# Patient Record
Sex: Female | Born: 1995 | Race: Black or African American | Hispanic: No | Marital: Single | Smoking: Never smoker
Health system: Southern US, Community
[De-identification: ages and names within clinical notes are randomized; demographics above are authoritative.]

## PROBLEM LIST (undated history)

## (undated) DIAGNOSIS — D649 Anemia, unspecified: Secondary | ICD-10-CM

## (undated) DIAGNOSIS — E669 Obesity, unspecified: Secondary | ICD-10-CM

## (undated) DIAGNOSIS — J45909 Unspecified asthma, uncomplicated: Secondary | ICD-10-CM

## (undated) HISTORY — PX: OOPHORECTOMY: SHX86

---

## 2019-12-15 ENCOUNTER — Emergency Department (HOSPITAL_COMMUNITY)
Admission: EM | Admit: 2019-12-15 | Discharge: 2019-12-15 | Disposition: A | Discharge status: Home / Self Care | Payer: Managed Care, Other (non HMO) | Attending: Emergency Medicine | Admitting: Emergency Medicine

## 2019-12-15 ENCOUNTER — Emergency Department (HOSPITAL_COMMUNITY): Payer: Managed Care, Other (non HMO)

## 2019-12-15 ENCOUNTER — Encounter (HOSPITAL_COMMUNITY): Payer: Self-pay

## 2019-12-15 ENCOUNTER — Other Ambulatory Visit: Payer: Self-pay

## 2019-12-15 DIAGNOSIS — J4521 Mild intermittent asthma with (acute) exacerbation: Secondary | ICD-10-CM | POA: Diagnosis not present

## 2019-12-15 DIAGNOSIS — R062 Wheezing: Secondary | ICD-10-CM | POA: Diagnosis present

## 2019-12-15 HISTORY — DX: Unspecified asthma, uncomplicated: J45.909

## 2019-12-15 MED ORDER — ALBUTEROL SULFATE HFA 108 (90 BASE) MCG/ACT IN AERS: 1.0000 | INHALATION_SPRAY | Freq: Four times a day (QID) | RESPIRATORY_TRACT | 0 refills | Status: DC | PRN

## 2019-12-15 MED ORDER — PREDNISONE 20 MG PO TABS
60.0000 mg | ORAL_TABLET | Freq: Once | ORAL | Status: AC
  Administered 2019-12-15: 60 mg via ORAL
  Filled 2019-12-15: qty 3

## 2019-12-15 MED ORDER — HYDROXYZINE HCL 25 MG PO TABS: 25.0000 mg | ORAL_TABLET | Freq: Four times a day (QID) | ORAL | 0 refills | Status: DC

## 2019-12-15 MED ORDER — ALBUTEROL SULFATE HFA 108 (90 BASE) MCG/ACT IN AERS
2.0000 | INHALATION_SPRAY | Freq: Once | RESPIRATORY_TRACT | Status: AC
  Administered 2019-12-15: 2 via RESPIRATORY_TRACT
  Filled 2019-12-15: qty 6.7

## 2019-12-15 MED ORDER — IPRATROPIUM-ALBUTEROL 0.5-2.5 (3) MG/3ML IN SOLN: 3.0000 mL | RESPIRATORY_TRACT | 0 refills | Status: DC | PRN

## 2019-12-15 MED ORDER — IPRATROPIUM BROMIDE HFA 17 MCG/ACT IN AERS
2.0000 | INHALATION_SPRAY | Freq: Once | RESPIRATORY_TRACT | Status: AC
  Administered 2019-12-15: 2 via RESPIRATORY_TRACT
  Filled 2019-12-15: qty 12.9

## 2019-12-15 MED ORDER — PREDNISONE 10 MG PO TABS: 40.0000 mg | ORAL_TABLET | Freq: Every day | ORAL | 0 refills | Status: AC

## 2019-12-15 NOTE — Discharge Instructions (Addendum)
You are seen today for cough and wheezing.  I think that your asthma is flaring up due to your cat.  Your chest x-ray was clear.  We are going to give you prescriptions for medication for your nebulizer machine as well as albuterol inhaler, prednisone and hydroxyzine.  Please follow-up with a new primary care doctor and give them a call as soon as possible. Thank you for allowing me to care for you today. Please return to the emergency department if you have new or worsening symptoms. Take your medications as instructed.

## 2019-12-15 NOTE — ED Notes (Signed)
An After Visit Summary was printed and given to the patient. Discharge instructions given and no further questions at this time. Pt breathing even and unlabored.

## 2019-12-15 NOTE — ED Triage Notes (Signed)
Pt states she is experiencing asthma related symptoms. Pt states she has had symptoms x3 days due to moving boxes and being out in the cold. Pt states she has been using albuterol inhaler with no relief. Pt states she was discharged from hospital with bronchopneumonia and completed prescriptions.

## 2019-12-15 NOTE — ED Notes (Signed)
Pt has inspiratory and expiratory wheezes, [name], PA notified regarding pt lung sounds and order for albuterol inhaler.

## 2019-12-15 NOTE — ED Provider Notes (Signed)
[facility] DEPT Provider Note   CSN: [id number] Arrival date & time: 12/15/19  1801     History Chief Complaint  Patient presents with  . Asthma    Leah Lamb is a 23 y.o. female.  23 year old female with past medical history of asthma presenting to the emergency department for cough and wheeze.  Patient reports that this has been ongoing off and on for several months.  Reports that this all started since she got her cat.  Had history of asthma in the past but it went away and then returned when she got a cat.  Reports dry cough with wheeze.  She reports that she has a nebulizer machine but has run out of the albuterol solution for it.  Also reports that she has tried "every over-the-counter allergy medicine" and that none of these helped.  She just moved here and does not have a primary care doctor.  Denies any fever, chills, chest pain, shortness of breath, nausea, vomiting, sick contacts.        Past Medical History:  Diagnosis Date  . Asthma     There are no problems to display for this patient.   Past Surgical History:  Procedure Laterality Date  . OOPHORECTOMY Left      OB History   No obstetric history on file.     No family history on file.  Social History   Tobacco Use  . Smoking status: Never Smoker  . Smokeless tobacco: Never Used  Substance Use Topics  . Alcohol use: Never  . Drug use: Never    Home Medications Prior to Admission medications   Medication Sig Start Date End Date Taking? Authorizing Provider  albuterol (VENTOLIN HFA) 108 (90 Base) MCG/ACT inhaler Inhale 1-2 puffs into the lungs every 6 (six) hours as needed for wheezing or shortness of breath. 12/15/19   Madilyn Hook A, PA-C  hydrOXYzine (ATARAX/VISTARIL) 25 MG tablet Take 1 tablet (25 mg total) by mouth every 6 (six) hours. 12/15/19   Alveria Apley, PA-C  ipratropium-albuterol (DUONEB) 0.5-2.5 (3) MG/3ML SOLN Take 3 mLs by nebulization  every 4 (four) hours as needed. 12/15/19 01/14/20  Alveria Apley, PA-C  predniSONE (DELTASONE) 10 MG tablet Take 4 tablets (40 mg total) by mouth daily for 5 days. 12/15/19 12/20/19  Alveria Apley, PA-C    Allergies    Amoxicillin and Motrin [ibuprofen]  Review of Systems   Review of Systems  Constitutional: Negative for chills, fatigue and fever.  HENT: Negative for congestion, rhinorrhea, sinus pain and sore throat.   Respiratory: Positive for cough, chest tightness and wheezing. Negative for shortness of breath and stridor.   Cardiovascular: Negative for chest pain, palpitations and leg swelling.  Gastrointestinal: Negative for abdominal pain, nausea and vomiting.  Genitourinary: Negative for dysuria.  Musculoskeletal: Negative for back pain.  Neurological: Negative for dizziness, light-headedness and headaches.    Physical Exam Updated Vital Signs BP 137/79   Pulse 79   Temp 98.7 F (37.1 C) (Oral)   Resp 20   LMP 11/26/2019   SpO2 100%   Physical Exam Vitals and nursing note reviewed.  Constitutional:      General: She is not in acute distress.    Appearance: Normal appearance. She is obese. She is not ill-appearing, toxic-appearing or diaphoretic.  HENT:     Head: Normocephalic.     Nose: Nose normal.     Mouth/Throat:     Mouth: Mucous membranes are moist.  Eyes:     Conjunctiva/sclera: Conjunctivae normal.  Cardiovascular:     Rate and Rhythm: Normal rate and regular rhythm.     Pulses: Normal pulses.  Pulmonary:     Effort: Pulmonary effort is normal. No respiratory distress.     Breath sounds: No stridor. Wheezing and rhonchi present. No rales.  Chest:     Chest wall: No tenderness.  Lymphadenopathy:     Cervical: No cervical adenopathy.  Skin:    General: Skin is dry.  Neurological:     Mental Status: She is alert.  Psychiatric:        Mood and Affect: Mood normal.     ED Results / Procedures / Treatments   Labs (all labs ordered are listed,  but only abnormal results are displayed) Labs Reviewed - No data to display  EKG EKG Interpretation  Date/Time:  Saturday December 15 2019 20:04:14 EST Ventricular Rate:  79 PR Interval:    QRS Duration: 82 QT Interval:  375 QTC Calculation: 430 R Axis:   80 Text Interpretation: Sinus rhythm Probable left atrial enlargement RSR' in V1 or V2, right VCD or RVH No previous tracing Confirmed by Gwyneth Sprout ([id number]) on 12/15/2019 9:40:41 PM   Radiology DG Chest 2 View  Result Date: 12/15/2019 CLINICAL DATA:  Asthma. EXAM: CHEST - 2 VIEW COMPARISON:  None. FINDINGS: The heart size and mediastinal contours are within normal limits. Both lungs are clear. The visualized skeletal structures are unremarkable. IMPRESSION: No active cardiopulmonary disease. Electronically Signed   By: Katherine Mantle M.D.   On: 12/15/2019 20:25    Procedures Procedures (including critical care time)  Medications Ordered in ED Medications  albuterol (VENTOLIN HFA) 108 (90 Base) MCG/ACT inhaler 2 puff (2 puffs Inhalation Given 12/15/19 1837)  predniSONE (DELTASONE) tablet 60 mg (60 mg Oral Given 12/15/19 2143)  ipratropium (ATROVENT HFA) inhaler 2 puff (2 puffs Inhalation Given 12/15/19 2143)    ED Course  I have reviewed the triage vital signs and the nursing notes.  Pertinent labs & imaging results that were available during my care of the patient were reviewed by me and considered in my medical decision making (see chart for details).  Clinical Course as of Dec 15 2155  Sat Dec 15, 2019  2152 Patient with history of asthma presenting with the same.  Reports symptoms got worse when she got a new cat a couple months ago.  Chest x-ray is clear.  No fever or signs of acute illness.  We will treat her with ipratropium, albuterol, prednisone.  Advised that she should be on a daily allergy medication or may need to get rid of her cat.  We will give her prescriptions for albuterol inhaler as well as  DuoNeb vials and prednisone and hydroxyzine.  She was given referral to new primary care doctor as she is new to this area.   [KM]    Clinical Course User Index [KM] Jeral Pinch   MDM Rules/Calculators/A&P                     Based on review of vitals, medical screening exam, lab work and/or imaging, there does not appear to be an acute, emergent etiology for the patient's symptoms. Counseled pt on good return precautions and encouraged both PCP and ED follow-up as needed.  Prior to discharge, I also discussed incidental imaging findings with patient in detail and advised appropriate, recommended follow-up in detail.  Clinical Impression: 1. Mild intermittent  asthma with exacerbation     Disposition: Discharge  Prior to providing a prescription for a controlled substance, I independently reviewed the patient's recent prescription history on the [place] Controlled Substance Reporting System. The patient had no recent or regular prescriptions and was deemed appropriate for a brief, less than 3 day prescription of narcotic for acute analgesia.  This note was prepared with assistance of Conservation officer, historic buildingsDragon voice recognition software. Occasional wrong-word or sound-a-like substitutions may have occurred due to the inherent limitations of voice recognition software.  Final Clinical Impression(s) / ED Diagnoses Final diagnoses:  Mild intermittent asthma with exacerbation    Rx / DC Orders ED Discharge Orders         Ordered    predniSONE (DELTASONE) 10 MG tablet  Daily     12/15/19 2156    albuterol (VENTOLIN HFA) 108 (90 Base) MCG/ACT inhaler  Every 6 hours PRN     12/15/19 2156    hydrOXYzine (ATARAX/VISTARIL) 25 MG tablet  Every 6 hours     12/15/19 2156    ipratropium-albuterol (DUONEB) 0.5-2.5 (3) MG/3ML SOLN  Every 4 hours PRN     12/15/19 2156           Jeral PinchMcLean, Caidence Kaseman A, PA-C 12/15/19 2156    Gwyneth SproutPlunkett, Whitney, MD 12/15/19 2345

## 2019-12-15 NOTE — ED Notes (Signed)
Pt transported to Xray. 

## 2019-12-27 ENCOUNTER — Ambulatory Visit: Payer: Managed Care, Other (non HMO) | Admitting: Adult Health Nurse Practitioner

## 2020-01-04 ENCOUNTER — Other Ambulatory Visit: Payer: Self-pay

## 2020-01-04 ENCOUNTER — Emergency Department (HOSPITAL_COMMUNITY): Payer: Managed Care, Other (non HMO)

## 2020-01-04 ENCOUNTER — Emergency Department (HOSPITAL_COMMUNITY)
Admission: EM | Admit: 2020-01-04 | Discharge: 2020-01-04 | Disposition: A | Discharge status: Home / Self Care | Payer: Managed Care, Other (non HMO) | Attending: Emergency Medicine | Admitting: Emergency Medicine

## 2020-01-04 ENCOUNTER — Encounter (HOSPITAL_COMMUNITY): Payer: Self-pay | Admitting: Emergency Medicine

## 2020-01-04 DIAGNOSIS — J4521 Mild intermittent asthma with (acute) exacerbation: Secondary | ICD-10-CM

## 2020-01-04 DIAGNOSIS — R062 Wheezing: Secondary | ICD-10-CM | POA: Diagnosis present

## 2020-01-04 MED ORDER — ALBUTEROL SULFATE HFA 108 (90 BASE) MCG/ACT IN AERS: 1.0000 | INHALATION_SPRAY | Freq: Four times a day (QID) | RESPIRATORY_TRACT | 0 refills | Status: DC | PRN

## 2020-01-04 MED ORDER — PREDNISONE 20 MG PO TABS
40.0000 mg | ORAL_TABLET | Freq: Once | ORAL | Status: AC
  Administered 2020-01-04: 40 mg via ORAL
  Filled 2020-01-04: qty 2

## 2020-01-04 MED ORDER — HYDROXYZINE HCL 25 MG PO TABS: 25.0000 mg | ORAL_TABLET | Freq: Four times a day (QID) | ORAL | 0 refills | Status: DC | PRN

## 2020-01-04 MED ORDER — PREDNISONE 20 MG PO TABS: 40.0000 mg | ORAL_TABLET | Freq: Every day | ORAL | 0 refills | Status: AC

## 2020-01-04 MED ORDER — ALBUTEROL SULFATE HFA 108 (90 BASE) MCG/ACT IN AERS
4.0000 | INHALATION_SPRAY | Freq: Once | RESPIRATORY_TRACT | Status: AC
  Administered 2020-01-04: 4 via RESPIRATORY_TRACT
  Filled 2020-01-04: qty 6.7

## 2020-01-04 NOTE — ED Triage Notes (Signed)
Pt reports has a cat that is flaring up her asthma. Reports coughing up green phlegm 3 days ago.

## 2020-01-04 NOTE — ED Provider Notes (Signed)
Emergency Department Provider Note   I have reviewed the triage vital signs and the nursing notes.   HISTORY  Chief Complaint Asthma   HPI Leah Lamb is a 24 y.o. female with PMH of asthma and seasonal allergies presents to the emergency department for evaluation of wheezing, shortness of breath, productive cough over the past several days.  Patient states that she is been out of her allergy medication, hydroxyzine, and thinks that her cat may be flaring up her allergies and asthma symptoms.  She describes some central chest tightness which is typical of her asthma flare.  She has had wheezing but does not seem to be responding to her nebulizer treatments at home.  She denies any fevers, chills, COVID-19 contacts.  She has had a slightly productive cough over the past several days which is unusual for her asthma symptoms.  Denies any other radiation of symptoms or modifying factors.  Past Medical History:  Diagnosis Date  . Asthma     There are no problems to display for this patient.   Past Surgical History:  Procedure Laterality Date  . OOPHORECTOMY Left     Allergies Amoxicillin and Motrin [ibuprofen]  No family history on file.  Social History Social History   Tobacco Use  . Smoking status: Never Smoker  . Smokeless tobacco: Never Used  Substance Use Topics  . Alcohol use: Never  . Drug use: Never    Review of Systems  Constitutional: No fever/chills Eyes: No visual changes. ENT: No sore throat. Cardiovascular: Positive central chest tightness typical of asthma flare.  Respiratory: Positive shortness of breath. Positive productive cough.  Gastrointestinal: No abdominal pain.  No nausea, no vomiting.  No diarrhea.  No constipation. Genitourinary: Negative for dysuria. Musculoskeletal: Negative for back pain. Skin: Negative for rash. Neurological: Negative for headaches, focal weakness or numbness.  10-point ROS otherwise  negative.  ____________________________________________   PHYSICAL EXAM:  VITAL SIGNS: ED Triage Vitals  Enc Vitals Group     BP 01/04/20 1458 129/80     Pulse Rate 01/04/20 1458 (!) 103     Resp 01/04/20 1458 19     Temp 01/04/20 1458 98.7 F (37.1 C)     Temp Source 01/04/20 1458 Oral     SpO2 01/04/20 1458 100 %   Constitutional: Alert and oriented. Well appearing and in no acute distress. Eyes: Conjunctivae are normal.  Head: Atraumatic. Nose: No congestion/rhinnorhea. Mouth/Throat: Mucous membranes are moist.   Neck: No stridor.   Cardiovascular: Tachycardia. Good peripheral circulation. Grossly normal heart sounds.   Respiratory: Slight increased respiratory effort.  No retractions. Lungs with bilateral end-expiratory wheezing. No rales or rhonchi.  Gastrointestinal: No distention.  Musculoskeletal: No gross deformities of extremities. Neurologic:  Normal speech and language.  Skin:  Skin is warm, dry and intact. No rash noted ____________________________________________  RADIOLOGY  DG Chest Portable 1 View  Result Date: 01/04/2020 CLINICAL DATA:  Dyspnea, productive cough, asthma EXAM: PORTABLE CHEST 1 VIEW COMPARISON:  12/15/2019 chest radiograph. FINDINGS: Stable cardiomediastinal silhouette with normal heart size. No pneumothorax. No pleural effusion. Hyperinflated lungs. No pulmonary edema. No acute consolidative airspace disease. IMPRESSION: Hyperinflated lungs, suggesting obstructive lung disease. Otherwise no active cardiopulmonary disease. Electronically Signed   By: Delbert Phenix M.D.   On: 01/04/2020 18:21    ____________________________________________   PROCEDURES  Procedure(s) performed:   Procedures  None  ____________________________________________   INITIAL IMPRESSION / ASSESSMENT AND PLAN / ED COURSE  Pertinent labs & imaging results  that were available during my care of the patient were reviewed by me and considered in my medical decision  making (see chart for details).   Patient presents to the emergency department with likely asthma flare symptoms.  She does have some central chest tightness but states this is typical of her asthma.  She does have wheezing in bilateral lung fields.  My suspicion for PE is very low.  She does have mild tachycardia on arrival but was using albuterol treatments prior to ED presentation.  She is not having pleuritic chest discomfort or other concerning historical features for PE.  I do plan for chest x-ray with her productive cough and will give albuterol, steroid here in the ED. I have sent refills for her medications including albuterol, hydroxyzine, and will send home on steroid burst. Patient in no acute distress.   CXR without infiltrate. Plan for asthma mgmt as an outpatient. Contact given for PCP locally. Discussed ED return precautions.  ____________________________________________  FINAL CLINICAL IMPRESSION(S) / ED DIAGNOSES  Final diagnoses:  Mild intermittent asthma with exacerbation     MEDICATIONS GIVEN DURING THIS VISIT:  Medications  albuterol (VENTOLIN HFA) 108 (90 Base) MCG/ACT inhaler 4 puff (4 puffs Inhalation Given 01/04/20 1900)  predniSONE (DELTASONE) tablet 40 mg (40 mg Oral Given 01/04/20 1858)     NEW OUTPATIENT MEDICATIONS STARTED DURING THIS VISIT:  Discharge Medication List as of 01/04/2020  6:43 PM    START taking these medications   Details  !! albuterol (VENTOLIN HFA) 108 (90 Base) MCG/ACT inhaler Inhale 1-2 puffs into the lungs every 6 (six) hours as needed for wheezing or shortness of breath., Starting Fri 01/04/2020, Normal    predniSONE (DELTASONE) 20 MG tablet Take 2 tablets (40 mg total) by mouth daily for 4 days., Starting Sat 01/05/2020, Until Wed 01/09/2020, Normal     !! - Potential duplicate medications found. Please discuss with provider.      Note:  This document was prepared using Dragon voice recognition software and may include unintentional  dictation errors.  Nanda Quinton, MD, [facility] Emergency Medicine    [place] Camps, [place], MD 01/05/20 [phone]

## 2020-01-04 NOTE — Discharge Instructions (Signed)
We believe that your symptoms are caused today by an exacerbation of your asthma.  Please take the prescribed medications and any medications that you have at home.  Follow up with your doctor as recommended.  If you develop any new or worsening symptoms, including but not limited to fever, persistent vomiting, worsening shortness of breath, or other symptoms that concern you, please return to the Emergency Department immediately. ° ° °Asthma °Asthma is a recurring condition in which the airways tighten and narrow. Asthma can make it difficult to breathe. It can cause coughing, wheezing, and shortness of breath. Asthma episodes, also called asthma attacks, range from minor to life-threatening. Asthma cannot be cured, but medicines and lifestyle changes can help control it. °CAUSES °Asthma is believed to be caused by inherited (genetic) and environmental factors, but its exact cause is unknown. Asthma may be triggered by allergens, lung infections, or irritants in the air. Asthma triggers are different for each person. Common triggers include:  °Animal dander. °Dust mites. °Cockroaches. °Pollen from trees or grass. °Mold. °Smoke. °Air pollutants such as dust, household cleaners, hair sprays, aerosol sprays, paint fumes, strong chemicals, or strong odors. °Cold air, weather changes, and winds (which increase molds and pollens in the air). °Strong emotional expressions such as crying or laughing hard. °Stress. °Certain medicines (such as aspirin) or types of drugs (such as beta-blockers). °Sulfites in foods and drinks. Foods and drinks that may contain sulfites include dried fruit, potato chips, and sparkling grape juice. °Infections or inflammatory conditions such as the flu, a cold, or an inflammation of the nasal membranes (rhinitis). °Gastroesophageal reflux disease (GERD). °Exercise or strenuous activity. °SYMPTOMS °Symptoms may occur immediately after asthma is triggered or many hours later. Symptoms  include: °Wheezing. °Excessive nighttime or early morning coughing. °Frequent or severe coughing with a common cold. °Chest tightness. °Shortness of breath. °DIAGNOSIS  °The diagnosis of asthma is made by a review of your medical history and a physical exam. Tests may also be performed. These may include: °Lung function studies. These tests show how much air you breathe in and out. °Allergy tests. °Imaging tests such as X-rays. °TREATMENT  °Asthma cannot be cured, but it can usually be controlled. Treatment involves identifying and avoiding your asthma triggers. It also involves medicines. There are 2 classes of medicine used for asthma treatment:  °Controller medicines. These prevent asthma symptoms from occurring. They are usually taken every day. °Reliever or rescue medicines. These quickly relieve asthma symptoms. They are used as needed and provide short-term relief. °Your health care provider will help you create an asthma action plan. An asthma action plan is a written plan for managing and treating your asthma attacks. It includes a list of your asthma triggers and how they may be avoided. It also includes information on when medicines should be taken and when their dosage should be changed. An action plan may also involve the use of a device called a peak flow meter. A peak flow meter measures how well the lungs are working. It helps you monitor your condition. °HOME CARE INSTRUCTIONS  °Take medicines only as directed by your health care provider. Speak with your health care provider if you have questions about how or when to take the medicines. °Use a peak flow meter as directed by your health care provider. Record and keep track of readings. °Understand and use the action plan to help minimize or stop an asthma attack without needing to seek medical care. °Control your home environment in the following   ways to help prevent asthma attacks: °Do not smoke. Avoid being exposed to secondhand smoke. °Change  your heating and air conditioning filter regularly. °Limit your use of fireplaces and wood stoves. °Get rid of pests (such as roaches and mice) and their droppings. °Throw away plants if you see mold on them. °Clean your floors and dust regularly. Use unscented cleaning products. °Try to have someone else vacuum for you regularly. Stay out of rooms while they are being vacuumed and for a short while afterward. If you vacuum, use a dust mask from a hardware store, a double-layered or microfilter vacuum cleaner bag, or a vacuum cleaner with a HEPA filter. °Replace carpet with wood, tile, or vinyl flooring. Carpet can trap dander and dust. °Use allergy-proof pillows, mattress covers, and box spring covers. °Wash bed sheets and blankets every week in hot water and dry them in a dryer. °Use blankets that are made of polyester or cotton. °Clean bathrooms and kitchens with bleach. If possible, have someone repaint the walls in these rooms with mold-resistant paint. Keep out of the rooms that are being cleaned and painted. °Wash hands frequently. °SEEK MEDICAL CARE IF:  °You have wheezing, shortness of breath, or a cough even if taking medicine to prevent attacks. °The colored mucus you cough up (sputum) is thicker than usual. °Your sputum changes from clear or [place] to yellow, green, gray, or bloody. °You have any problems that may be related to the medicines you are taking (such as a rash, itching, swelling, or trouble breathing). °You are using a reliever medicine more than 2-3 times per week. °Your peak flow is still at 50-79% of your personal best after following your action plan for 1 hour. °You have a fever. °SEEK IMMEDIATE MEDICAL CARE IF:  °You seem to be getting worse and are unresponsive to treatment during an asthma attack. °You are short of breath even at rest. °You get short of breath when doing very little physical activity. °You have difficulty eating, drinking, or talking due to asthma symptoms. °You  develop chest pain. °You develop a fast heartbeat. °You have a bluish color to your lips or fingernails. °You are light-headed, dizzy, or faint. °Your peak flow is less than 50% of your personal best. °MAKE SURE YOU:  °Understand these instructions. °Will watch your condition. °Will get help right away if you are not doing well or get worse. °Document Released: 12/13/2005 Document Revised: 04/29/2014 Document Reviewed: 07/12/2013 °[employer]® Patient Information ©2015 [employer]. This information is not intended to replace advice given to you by your health care provider. Make sure you discuss any questions you have with your health care provider. ° °How to Use a Nebulizer °If you have asthma or other breathing problems, you might need to breathe in (inhale) medicine. This can be done with a nebulizer. A nebulizer is a device that turns liquid medicine into a mist that you can inhale.  °There are different kinds of nebulizers. Most are small. With some, you breathe in through a mouthpiece. With others, a mask fits over your nose and mouth. Most nebulizers must be connected to a small air compressor. Air is forced through tubing from the compressor to the nebulizer. The forced air changes the liquid into a fine spray. °RISKS AND COMPLICATIONS °The nebulizer must work properly for it to help your breathing. If the nebulizer does not produce mist, or if foam comes out, this indicates that the nebulizer is not working properly. Sometimes a filter can get clogged, or   there might be a problem with the air compressor. Check the instruction booklet that came with your nebulizer. It should tell you how to fix problems or where to call for help. You should have at least one extra nebulizer at home. That way, you will always have one when you need it.  °HOW TO PREPARE BEFORE USING THE NEBULIZER °Take these steps before using the nebulizer: °Check your medicine. Make sure it has not expired and is not damaged in any way.    °Wash your hands with soap and water.   °Put all the parts of your nebulizer on a sturdy, flat surface. Make sure the tubing connects the compressor and the nebulizer. °Measure the liquid medicine according to your health care provider's instructions. Pour it into the nebulizer. °Attach the mouthpiece or mask.   °Test the nebulizer by turning it on to make sure a spray is coming out. Then, turn it off.   °HOW TO USE THE NEBULIZER °Sit down and focus on staying relaxed.   °If your nebulizer has a mask, put it over your nose and mouth. If you use a mouthpiece, put it in your mouth. Press your lips firmly around the mouthpiece. °Turn on the nebulizer.   °Breathe out.   °Some nebulizers have a finger valve. If yours does, cover up the air hole so the air gets to the nebulizer. °Once the medicine begins to mist out, take slow, deep breaths. If there is a finger valve, release it at the end of your breath. °Continue taking slow, deep breaths until the nebulizer is empty.   °Be sure to stop the machine at any point if you start coughing or if the medicine foams or bubbles. °HOW TO CLEAN THE NEBULIZER  °The nebulizer and all its parts must be kept very clean. Follow the manufacturer's instructions for cleaning. For most nebulizers, you should follow these guidelines: °Wash the nebulizer after each use. Use warm water and soap. Rinse it well. Shake the nebulizer to remove extra water. Put it on a clean towel until it is completely dry. To make sure it is dry, put the nebulizer back together. Turn on the compressor for a few minutes. This will blow air through the nebulizer.   °Do not wash the tubing or the finger valve.   °Store the nebulizer in a dust-free place.   °Inspect the filter every week. Replace it any time it looks dirty.   °Sometimes the nebulizer will need a more complete cleaning. The instruction booklet should say how often you need to do this. °SEEK MEDICAL CARE IF:  °You continue to have difficulty  breathing.   °You have trouble using the nebulizer.   °Document Released: 12/01/2009 Document Revised: 04/29/2014 Document Reviewed: 06/04/2013 °Exit[employer]® Patient Information ©2015 [employer]. This information is not intended to replace advice given to you by your health care provider. Make sure you discuss any questions you have with your health care provider. ° °How to Use an Inhaler °Proper inhaler technique is very important. Good technique ensures that the medicine reaches the lungs. Poor technique results in depositing the medicine on the tongue and back of the throat rather than in the airways. If you do not use the inhaler with good technique, the medicine will not help you. °STEPS TO FOLLOW IF USING AN INHALER WITHOUT AN EXTENSION TUBE °Remove the cap from the inhaler. °If you are using the inhaler for the first time, you will need to prime it. Shake the inhaler for   5 seconds and release four puffs into the air, away from your face. Ask your health care provider or pharmacist if you have questions about priming your inhaler. °Shake the inhaler for 5 seconds before each breath in (inhalation). °Position the inhaler so that the top of the canister faces up. °Put your index finger on the top of the medicine canister. Your thumb supports the bottom of the inhaler. °Open your mouth. °Either place the inhaler between your teeth and place your lips tightly around the mouthpiece, or hold the inhaler 1-2 inches away from your open mouth. If you are unsure of which technique to use, ask your health care provider. °Breathe out (exhale) normally and as completely as possible. °Press the canister down with your index finger to release the medicine. °At the same time as the canister is pressed, inhale deeply and slowly until your lungs are completely filled. This should take 4-6 seconds. Keep your tongue down. °Hold the medicine in your lungs for 5-10 seconds (10 seconds is best). This helps the medicine get into the  small airways of your lungs. °Breathe out slowly, through pursed lips. Whistling is an example of pursed lips. °Wait at least 15-30 seconds between puffs. Continue with the above steps until you have taken the number of puffs your health care provider has ordered. Do not use the inhaler more than your health care provider tells you. °Replace the cap on the inhaler. °Follow the directions from your health care provider or the inhaler insert for cleaning the inhaler. °STEPS TO FOLLOW IF USING AN INHALER WITH AN EXTENSION (SPACER) °Remove the cap from the inhaler. °If you are using the inhaler for the first time, you will need to prime it. Shake the inhaler for 5 seconds and release four puffs into the air, away from your face. Ask your health care provider or pharmacist if you have questions about priming your inhaler. °Shake the inhaler for 5 seconds before each breath in (inhalation). °Place the open end of the spacer onto the mouthpiece of the inhaler. °Position the inhaler so that the top of the canister faces up and the spacer mouthpiece faces you. °Put your index finger on the top of the medicine canister. Your thumb supports the bottom of the inhaler and the spacer. °Breathe out (exhale) normally and as completely as possible. °Immediately after exhaling, place the spacer between your teeth and into your mouth. Close your lips tightly around the spacer. °Press the canister down with your index finger to release the medicine. °At the same time as the canister is pressed, inhale deeply and slowly until your lungs are completely filled. This should take 4-6 seconds. Keep your tongue down and out of the way. °Hold the medicine in your lungs for 5-10 seconds (10 seconds is best). This helps the medicine get into the small airways of your lungs. Exhale. °Repeat inhaling deeply through the spacer mouthpiece. Again hold that breath for up to 10 seconds (10 seconds is best). Exhale slowly. If it is difficult to take  this second deep breath through the spacer, breathe normally several times through the spacer. Remove the spacer from your mouth. °Wait at least 15-30 seconds between puffs. Continue with the above steps until you have taken the number of puffs your health care provider has ordered. Do not use the inhaler more than your health care provider tells you. °Remove the spacer from the inhaler, and place the cap on the inhaler. °Follow the directions from your health care provider   or the inhaler insert for cleaning the inhaler and spacer. °If you are using different kinds of inhalers, use your quick relief medicine to open the airways 10-15 minutes before using a steroid if instructed to do so by your health care provider. If you are unsure which inhalers to use and the order of using them, ask your health care provider, nurse, or respiratory therapist. °If you are using a steroid inhaler, always rinse your mouth with water after your last puff, then gargle and spit out the water. Do not swallow the water. °AVOID: °Inhaling before or after starting the spray of medicine. It takes practice to coordinate your breathing with triggering the spray. °Inhaling through the nose (rather than the mouth) when triggering the spray. °HOW TO DETERMINE IF YOUR INHALER IS FULL OR NEARLY EMPTY °You cannot know when an inhaler is empty by shaking it. A few inhalers are now being made with dose counters. Ask your health care provider for a prescription that has a dose counter if you feel you need that extra help. If your inhaler does not have a counter, ask your health care provider to help you determine the date you need to refill your inhaler. Write the refill date on a calendar or your inhaler canister. Refill your inhaler 7-10 days before it runs out. Be sure to keep an adequate supply of medicine. This includes making sure it is not expired, and that you have a spare inhaler.  °SEEK MEDICAL CARE IF:  °Your symptoms are only partially  relieved with your inhaler. °You are having trouble using your inhaler. °You have some increase in phlegm. °SEEK IMMEDIATE MEDICAL CARE IF:  °You feel little or no relief with your inhalers. You are still wheezing and are feeling shortness of breath or tightness in your chest or both. °You have dizziness, headaches, or a fast heart rate. °You have chills, fever, or night sweats. °You have a noticeable increase in phlegm production, or there is blood in the phlegm. °MAKE SURE YOU:  °Understand these instructions. °Will watch your condition. °Will get help right away if you are not doing well or get worse. °Document Released: 12/10/2000 Document Revised: 10/03/2013 Document Reviewed: 07/12/2013 °Exit[employer]® Patient Information ©2015 [employer]. This information is not intended to replace advice given to you by your health care provider. Make sure you discuss any questions you have with your health care provider. ° ° ° °

## 2020-01-11 ENCOUNTER — Ambulatory Visit (INDEPENDENT_AMBULATORY_CARE_PROVIDER_SITE_OTHER): Payer: Managed Care, Other (non HMO) | Admitting: Allergy

## 2020-01-11 ENCOUNTER — Encounter: Payer: Self-pay | Admitting: Allergy

## 2020-01-11 ENCOUNTER — Other Ambulatory Visit: Payer: Self-pay

## 2020-01-11 VITALS — BP 104/62 | HR 93 | Temp 97.8°F | Resp 16 | Ht 64.0 in | Wt 196.6 lb

## 2020-01-11 DIAGNOSIS — H1013 Acute atopic conjunctivitis, bilateral: Secondary | ICD-10-CM

## 2020-01-11 DIAGNOSIS — J4540 Moderate persistent asthma, uncomplicated: Secondary | ICD-10-CM | POA: Diagnosis not present

## 2020-01-11 DIAGNOSIS — T781XXD Other adverse food reactions, not elsewhere classified, subsequent encounter: Secondary | ICD-10-CM | POA: Diagnosis not present

## 2020-01-11 MED ORDER — ALBUTEROL SULFATE (2.5 MG/3ML) 0.083% IN NEBU: 2.5000 mg | INHALATION_SOLUTION | Freq: Four times a day (QID) | RESPIRATORY_TRACT | 1 refills | Status: DC | PRN

## 2020-01-11 MED ORDER — BUDESONIDE-FORMOTEROL FUMARATE 160-4.5 MCG/ACT IN AERO: 2.0000 | INHALATION_SPRAY | Freq: Two times a day (BID) | RESPIRATORY_TRACT | 5 refills | Status: DC

## 2020-01-11 MED ORDER — MONTELUKAST SODIUM 10 MG PO TABS: 10.0000 mg | ORAL_TABLET | Freq: Every day | ORAL | 5 refills | Status: DC

## 2020-01-11 NOTE — Progress Notes (Signed)
New Patient Note  RE: Leah Lamb MRN: [id number] DOB: 12-21-1996 Date of Office Visit: 01/11/2020  Referring provider: No ref. provider found Primary care provider: Patient, No Pcp Per  Chief Complaint:  Cat flaring asthma  History of present illness: Leah Lamb is a 24 y.o. female presenting today for evaluation of asthma and cat allergy.   She is new to the area and has not identified PCP yet.   She has a history of asthma as a child and states she had a 3 year period where she was asymptomatic until recently.  She got a cat about 5 months ago and she feels it is triggering asthma attacks.  She finds herself coughing more, wheezing and having chest tightness.  She is using her albuterol nebulizer daily mostly at night.  She also reports using albuterol inhaler before exercising more now as well.  She did have 2 ED visits for asthma symptoms.  On 12/15/2019 in ED she was treated with albuterol, atrovent and prednisone.  CXR was normal.  She was given prescriptions for albuterol, duoneb, prendisone and hydroxyzine.  ED visit from 01/04/2020 she has a CXR that again was normal.  She was treated with albuterol and prednisone.    She does report several hospitalizations as a child but none in adulthood.  She states she used to singulair and claritin when she was younger.   She does report itchy eyes year-round.  Denies any nasal congestion/drainage or sneezing symptoms.   No history of eczema.    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  She has noted this over the past 3 years.  She states she has not had any latex exposure that she is aware of.    Review of systems: Review of Systems  Constitutional: Negative.   HENT: Negative.   Eyes: Negative.   Respiratory: Positive for cough and wheezing.   Cardiovascular: Negative.   Gastrointestinal: Negative.   Musculoskeletal: Negative.   Skin: Negative.     Neurological: Negative.     All other systems negative unless noted above in HPI  Past medical history: Past Medical History:  Diagnosis Date  . Asthma     Past surgical history: Past Surgical History:  Procedure Laterality Date  . OOPHORECTOMY Left     Family history:  Family History  Problem Relation Age of Onset  . Asthma Neg Hx   . Allergic rhinitis Neg Hx   . Eczema Neg Hx     Social history: She lives in an apartment with carpeting with electric heating, heat pump and central cooling.  No concern for water damage, mildew or roaches in the home.  She is a patient care advocate.  No smoking history or exposure.   Medication List: Current Outpatient Medications  Medication Sig Dispense Refill  . albuterol (PROVENTIL) (2.5 MG/3ML) 0.083% nebulizer solution Take 2.5 mg by nebulization every 6 (six) hours as needed for wheezing or shortness of breath.    Marland Kitchen albuterol (VENTOLIN HFA) 108 (90 Base) MCG/ACT inhaler Inhale 1-2 puffs into the lungs every 6 (six) hours as needed for wheezing or shortness of breath. 8 g 0  . hydrOXYzine (ATARAX/VISTARIL) 25 MG tablet Take 1 tablet (25 mg total) by mouth every 6 (six) hours as needed for itching (allergy symptoms). 12 tablet 0  . budesonide-formoterol (SYMBICORT) 160-4.5 MCG/ACT inhaler Inhale 2 puffs into the lungs 2 (two) times daily.  1 Inhaler 5  . montelukast (SINGULAIR) 10 MG tablet Take 1 tablet (10 mg total) by mouth at bedtime. 30 tablet 5   No current facility-administered medications for this visit.    Known medication allergies: Allergies  Allergen Reactions  . Amoxicillin Hives  . Motrin [Ibuprofen] Itching    Physical examination: Blood pressure 104/62, pulse 93, temperature 97.8 F (36.6 C), temperature source Temporal, resp. rate 16, height 5\' 4"  (1.626 m), weight 196 lb 9.6 oz (89.2 kg), last menstrual period 12/16/2019, SpO2 100 %.  General: Alert, interactive, in no acute distress. HEENT: PERRLA, TMs  pearly gray, turbinates mildly edematous without discharge, post-pharynx non erythematous. Neck: Supple without lymphadenopathy. Lungs: Mildly decreased breath sounds with expiratory wheezing bilaterally. {no increased work of breathing.   Decrease wheeze after albuterol administration. CV: Normal S1, S2 without murmurs. Abdomen: Nondistended, nontender. Skin: Warm and dry, without lesions or rashes. Extremities:  No clubbing, cyanosis or edema. Neuro:   Grossly intact.  Diagnositics/Labs:  Spirometry: FEV1: 1.29L 44%, FVC: 2.12L 64% predicted.  Post-BD with 36% improvement in FEV1 to 1.76L 61% predicted.    Allergy testing: deferred due to decreased lung function  Assessment and plan:   Asthma, mod persistent  - not controlled at this time with cat exposure being a significant trigger  - start Symbicort 160mg  2 puffs twice a day  - start Singulair 10mg  daily at bedtime.  If you notice any change in mood/behavior/sleep after starting Singulair then stop this medication and let 12/18/2019 know.  Symptoms resolve after stopping the medication.    - have access to albuterol inhaler 2 puffs or albuterol 1 vial via nebulizer every 4-6 hours as needed for cough/wheeze/shortness of breath/chest tightness.  May use 15-20 minutes prior to activity.   Monitor frequency of use.    - use pump inhalers with spacer device (provided today)  - Keep cat out of your bedroom.  If asthma is unable to be well-controlled with medication management then will need to consider re-homing cat to decrease your allergen exposure and better control asthma   Asthma control goals:   Full participation in all desired activities (may need albuterol before activity)  Albuterol use two time or less a week on average (not counting use with activity)  Cough interfering with sleep two time or less a month  Oral steroids no more than once a year  No hospitalizations  Adverse food reaction  -  continue avoidance of banana and  avocado in the diet.   - banana and avocado can be associated with a latex allergy thus would be cautious with latex exposure.   It also could be related to pollen food allergy syndrome if you are sensitive to pollens (environmental panel will determine this).    - if labs return positive then would recommend you have access to an epinephrine device.    Conjunctivitis, allergic  - as above will obtain environmental allergy panel  - can use over-the-counter Pataday 1 drop each eye daily as needed for itchy eyes  Follow-up in 2-3 months or sooner if needed  I appreciate the opportunity to take part in Berline's care. Please do not hesitate to contact me with questions.  Sincerely,   , MD Allergy/Immunology [facility]

## 2020-01-11 NOTE — Patient Instructions (Addendum)
Asthma, moderate persistent  - not controlled at this time with cat exposure being a significant trigger  - start Symbicort 160mg  2 puffs twice a day  - start Singulair 10mg  daily at bedtime.  If you notice any change in mood/behavior/sleep after starting Singulair then stop this medication and let know.  Symptoms resolve after stopping the medication.    - have access to albuterol inhaler 2 puffs or albuterol 1 vial via nebulizer every 4-6 hours as needed for cough/wheeze/shortness of breath/chest tightness.  May use 15-20 minutes prior to activity.   Monitor frequency of use.    - use pump inhalers with spacer device (provided today)  - Keep cat out of your bedroom.  If asthma is unable to be well-controlled with medication management then will need to consider re-homing cat to decrease your allergen exposure and better control asthma   Asthma control goals:   Full participation in all desired activities (may need albuterol before activity)  Albuterol use two time or less a week on average (not counting use with activity)  Cough interfering with sleep two time or less a month  Oral steroids no more than once a year  No hospitalizations  Adverse food reaction  -  continue avoidance of banana and avocado in the diet.    - banana and avocado can be associated with a latex allergy thus would be cautious with latex exposure.   It also could be related to pollen food allergy syndrome if you are sensitive to pollens (environmental panel will determine this).    - if labs return positive then would recommend you have access to an epinephrine device.    Itchy eyes  - as above will obtain environmental allergy panel  - can use over-the-counter Pataday 1 drop each eye daily as needed for itchy eyes  Follow-up in 2-3 months or sooner if needed

## 2020-01-13 LAB — ALLERGEN BANANA: Allergen Banana IgE: <0.1 kU/L

## 2020-01-13 LAB — ALLERGEN AVOCADO F96: F096-IgE Avocado: <0.1 kU/L

## 2020-01-14 LAB — CBC WITH DIFFERENTIAL/PLATELET
Basophils Absolute: 0 10*3/uL (ref 0.0–0.2)
Basos: 1 %
EOS (ABSOLUTE): 0.6 10*3/uL — ABNORMAL HIGH (ref 0.0–0.4)
Eos: 13 %
Hematocrit: 34.7 % (ref 34.0–46.6)
Hemoglobin: 10.2 g/dL — ABNORMAL LOW (ref 11.1–15.9)
Immature Grans (Abs): 0 10*3/uL (ref 0.0–0.1)
Immature Granulocytes: 0 %
Lymphocytes Absolute: 2.2 10*3/uL (ref 0.7–3.1)
Lymphs: 45 %
MCH: 22.3 pg — ABNORMAL LOW (ref 26.6–33.0)
MCHC: 29.4 g/dL — ABNORMAL LOW (ref 31.5–35.7)
MCV: 76 fL — ABNORMAL LOW (ref 79–97)
Monocytes Absolute: 0.4 10*3/uL (ref 0.1–0.9)
Monocytes: 8 %
Neutrophils Absolute: 1.6 10*3/uL (ref 1.4–7.0)
Neutrophils: 33 %
Platelets: 368 10*3/uL (ref 150–450)
RBC: 4.57 x10E6/uL (ref 3.77–5.28)
RDW: 17.2 % — ABNORMAL HIGH (ref 11.7–15.4)
WBC: 5 10*3/uL (ref 3.4–10.8)

## 2020-01-14 LAB — IGE+ALLERGENS ZONE 2(30)
Alternaria Alternata IgE: <0.1 kU/L
Amer Sycamore IgE Qn: <0.1 kU/L
Aspergillus Fumigatus IgE: 0.64 kU/L — AB
Bahia Grass IgE: 0.18 kU/L — AB
Bermuda Grass IgE: <0.1 kU/L
Cat Dander IgE: 4.03 kU/L — AB
Cedar, Mountain IgE: <0.1 kU/L
Cladosporium Herbarum IgE: 0.21 kU/L — AB
Cockroach, American IgE: <0.1 kU/L
Common Silver Birch IgE: <0.1 kU/L
D Farinae IgE: <0.1 kU/L
D Pteronyssinus IgE: <0.1 kU/L
Dog Dander IgE: 0.91 kU/L — AB
Elm, American IgE: 0.18 kU/L — AB
Hickory, White IgE: <0.1 kU/L
IgE (Immunoglobulin E), Serum: 23 IU/mL (ref 6–495)
Johnson Grass IgE: <0.1 kU/L
Maple/Box Elder IgE: <0.1 kU/L
Mucor Racemosus IgE: <0.1 kU/L
Mugwort IgE Qn: <0.1 kU/L
Nettle IgE: <0.1 kU/L
Oak, White IgE: 0.18 kU/L — AB
Penicillium Chrysogen IgE: 0.12 kU/L — AB
Pigweed, Rough IgE: <0.1 kU/L
Plantain, English IgE: 0.12 kU/L — AB
Ragweed, Short IgE: <0.1 kU/L
Sheep Sorrel IgE Qn: 0.15 kU/L — AB
Stemphylium Herbarum IgE: 0.15 kU/L — AB
Sweet gum IgE RAST Ql: <0.1 kU/L
Timothy Grass IgE: <0.1 kU/L
White Mulberry IgE: <0.1 kU/L

## 2020-01-18 ENCOUNTER — Telehealth: Payer: Self-pay

## 2020-01-18 DIAGNOSIS — D509 Iron deficiency anemia, unspecified: Secondary | ICD-10-CM

## 2020-01-18 NOTE — Telephone Encounter (Signed)
Pt states that she could not come today, but would come on Monday January 21, 2020.

## 2020-01-18 NOTE — Telephone Encounter (Signed)
Labs given to [employer] from [employer]

## 2020-01-18 NOTE — Telephone Encounter (Signed)
Needs labs per Dr Delorse Lek

## 2020-01-21 ENCOUNTER — Other Ambulatory Visit: Payer: Self-pay

## 2020-01-21 ENCOUNTER — Encounter (HOSPITAL_COMMUNITY): Payer: Self-pay | Admitting: Emergency Medicine

## 2020-01-21 ENCOUNTER — Emergency Department (HOSPITAL_COMMUNITY)
Admission: EM | Admit: 2020-01-21 | Discharge: 2020-01-21 | Disposition: A | Discharge status: Home / Self Care | Payer: Managed Care, Other (non HMO) | Attending: Emergency Medicine | Admitting: Emergency Medicine

## 2020-01-21 DIAGNOSIS — Z79899 Other long term (current) drug therapy: Secondary | ICD-10-CM | POA: Diagnosis not present

## 2020-01-21 DIAGNOSIS — J4541 Moderate persistent asthma with (acute) exacerbation: Secondary | ICD-10-CM | POA: Diagnosis not present

## 2020-01-21 DIAGNOSIS — R062 Wheezing: Secondary | ICD-10-CM | POA: Diagnosis present

## 2020-01-21 MED ORDER — PREDNISONE 20 MG PO TABS
60.0000 mg | ORAL_TABLET | Freq: Once | ORAL | Status: AC
  Administered 2020-01-21: 60 mg via ORAL
  Filled 2020-01-21: qty 3

## 2020-01-21 MED ORDER — FAMOTIDINE 20 MG PO TABS
20.0000 mg | ORAL_TABLET | Freq: Once | ORAL | Status: AC
  Administered 2020-01-21: 20 mg via ORAL
  Filled 2020-01-21: qty 1

## 2020-01-21 MED ORDER — MAGNESIUM GLUCONATE 500 MG PO TABS
1000.0000 mg | ORAL_TABLET | Freq: Once | ORAL | Status: AC
  Administered 2020-01-21: 1000 mg via ORAL
  Filled 2020-01-21: qty 2

## 2020-01-21 MED ORDER — AEROCHAMBER PLUS FLO-VU MEDIUM MISC
1.0000 | Freq: Once | Status: AC
  Administered 2020-01-21: 1
  Filled 2020-01-21: qty 1

## 2020-01-21 MED ORDER — ALBUTEROL SULFATE HFA 108 (90 BASE) MCG/ACT IN AERS: 2.0000 | INHALATION_SPRAY | RESPIRATORY_TRACT | 1 refills | Status: DC | PRN

## 2020-01-21 MED ORDER — ALBUTEROL SULFATE HFA 108 (90 BASE) MCG/ACT IN AERS
8.0000 | INHALATION_SPRAY | Freq: Once | RESPIRATORY_TRACT | Status: AC
  Administered 2020-01-21: 8 via RESPIRATORY_TRACT
  Filled 2020-01-21: qty 6.7

## 2020-01-21 MED ORDER — IPRATROPIUM BROMIDE 0.02 % IN SOLN
1.0000 mg | Freq: Once | RESPIRATORY_TRACT | Status: AC
  Administered 2020-01-21: 1 mg via RESPIRATORY_TRACT
  Filled 2020-01-21: qty 5

## 2020-01-21 MED ORDER — METHYLPREDNISOLONE 4 MG PO TBPK: ORAL_TABLET | ORAL | 0 refills | Status: DC

## 2020-01-21 MED ORDER — FAMOTIDINE IN NACL 20-0.9 MG/50ML-% IV SOLN: 20.0000 mg | Freq: Once | INTRAVENOUS | Status: DC

## 2020-01-21 MED ORDER — LEVOCETIRIZINE DIHYDROCHLORIDE 5 MG PO TABS: 5.0000 mg | ORAL_TABLET | Freq: Every evening | ORAL | 0 refills | Status: DC

## 2020-01-21 MED ORDER — ALBUTEROL SULFATE HFA 108 (90 BASE) MCG/ACT IN AERS
8.0000 | INHALATION_SPRAY | Freq: Once | RESPIRATORY_TRACT | Status: AC
  Administered 2020-01-21: 8 via RESPIRATORY_TRACT

## 2020-01-21 NOTE — Discharge Instructions (Addendum)
Get help right away if: °Your peak flow reading is less than 50% of your personal best. This is in the red zone, which means "danger." °You have severe trouble breathing. °You develop chest pain or discomfort. °Your medicines no longer seem to be helping. °You vomit. °You cannot eat or drink without vomiting. °You are coughing up yellow, green, brown, or bloody mucus. °You have a fever and your symptoms suddenly get worse. °You have trouble swallowing. °You feel very tired, and breathing becomes tiring. °

## 2020-01-21 NOTE — ED Provider Notes (Signed)
[facility]-EMERGENCY DEPT Provider Note   CSN: [id number] Arrival date & time: 01/21/20  2951     History Chief Complaint  Patient presents with  . Asthma    Leah Lamb is a 24 y.o. female who presents emergency department with chief complaint of wheezing.  She has a history of moderate persistent asthma.  She was seen here for the same 2 weeks ago.  She had a small prednisone burst which improved her symptoms.  She is using her inhaler multiple times a day.  Her nebulizer is unfortunately not working at home.  She is from out of town and just moved to [place].  She has care with an allergist only and does not have a primary care physician.  She is currently taking Singulair and albuterol.  She just got a cat that she is allergic to which is making her have symptoms daily.  Patient denies fever, chills, symptoms of URI.  HPI     Past Medical History:  Diagnosis Date  . Asthma     There are no problems to display for this patient.   Past Surgical History:  Procedure Laterality Date  . OOPHORECTOMY Left      OB History   No obstetric history on file.     Family History  Problem Relation Age of Onset  . Asthma Neg Hx   . Allergic rhinitis Neg Hx   . Eczema Neg Hx     Social History   Tobacco Use  . Smoking status: Never Smoker  . Smokeless tobacco: Never Used  Substance Use Topics  . Alcohol use: Never  . Drug use: Never    Home Medications Prior to Admission medications   Medication Sig Start Date End Date Taking? Authorizing Provider  albuterol (PROVENTIL) (2.5 MG/3ML) 0.083% nebulizer solution Take 3 mLs (2.5 mg total) by nebulization every 6 (six) hours as needed for wheezing or shortness of breath. 01/11/20  Yes Padgett, Pilar Grammes, MD  albuterol (VENTOLIN HFA) 108 (90 Base) MCG/ACT inhaler Inhale 1-2 puffs into the lungs every 6 (six) hours as needed for wheezing or shortness of breath. 12/15/19  Yes Ronnie Doss A,  PA-C  hydrOXYzine (ATARAX/VISTARIL) 25 MG tablet Take 1 tablet (25 mg total) by mouth every 6 (six) hours as needed for itching (allergy symptoms). 01/04/20  Yes Long, Arlyss Repress, MD  montelukast (SINGULAIR) 10 MG tablet Take 1 tablet (10 mg total) by mouth at bedtime. 01/11/20  Yes Padgett, Pilar Grammes, MD  albuterol (VENTOLIN HFA) 108 (90 Base) MCG/ACT inhaler Inhale 2 puffs into the lungs every 4 (four) hours as needed for wheezing or shortness of breath. 01/21/20   Arthor Captain, PA-C  budesonide-formoterol (SYMBICORT) 160-4.5 MCG/ACT inhaler Inhale 2 puffs into the lungs 2 (two) times daily. 01/11/20   Marcelyn Bruins, MD  levocetirizine (XYZAL) 5 MG tablet Take 1 tablet (5 mg total) by mouth every evening. 01/21/20   Arthor Captain, PA-C  methylPREDNISolone (MEDROL DOSEPAK) 4 MG TBPK tablet Use as directed 01/21/20   Arthor Captain, PA-C    Allergies    Amoxicillin and Motrin [ibuprofen]  Review of Systems   Review of Systems Ten systems reviewed and are negative for acute change, except as noted in the HPI.   Physical Exam Updated Vital Signs BP 133/88   Pulse 88   Temp 97.9 F (36.6 C) (Oral)   Resp 18   Ht 5\' 2"  (1.575 m)   Wt 88.9 kg   LMP 01/11/2020  SpO2 100%   BMI 35.85 kg/m   Physical Exam Vitals and nursing note reviewed.  Constitutional:      General: She is not in acute distress.    Appearance: She is well-developed. She is not diaphoretic.  HENT:     Head: Normocephalic and atraumatic.  Eyes:     General: No scleral icterus.    Conjunctiva/sclera: Conjunctivae normal.  Cardiovascular:     Rate and Rhythm: Normal rate and regular rhythm.     Heart sounds: Normal heart sounds. No murmur. No friction rub. No gallop.   Pulmonary:     Effort: No respiratory distress.     Breath sounds: Wheezing present.     Comments: Tight, poor air movement, diffuse inspiratory and expiratory wheezing. Abdominal:     General: Bowel sounds are normal. There is  no distension.     Palpations: Abdomen is soft. There is no mass.     Tenderness: There is no abdominal tenderness. There is no guarding.  Musculoskeletal:     Cervical back: Normal range of motion.  Skin:    General: Skin is warm and dry.  Neurological:     Mental Status: She is alert and oriented to person, place, and time.  Psychiatric:        Behavior: Behavior normal.     ED Results / Procedures / Treatments   Labs (all labs ordered are listed, but only abnormal results are displayed) Labs Reviewed - No data to display  EKG None  Radiology No results found.  Procedures Procedures (including critical care time)  Medications Ordered in ED Medications  AeroChamber Plus Flo-Vu Medium MISC 1 each (1 each Other Given 01/21/20 0814)  albuterol (VENTOLIN HFA) 108 (90 Base) MCG/ACT inhaler 8 puff (8 puffs Inhalation Given 01/21/20 0813)  ipratropium (ATROVENT) nebulizer solution 1 mg (1 mg Nebulization Given 01/21/20 0836)  magnesium gluconate (MAGONATE) tablet 1,000 mg (1,000 mg Oral Given 01/21/20 0821)  predniSONE (DELTASONE) tablet 60 mg (60 mg Oral Given 01/21/20 0813)  famotidine (PEPCID) tablet 20 mg (20 mg Oral Given 01/21/20 0813)  albuterol (VENTOLIN HFA) 108 (90 Base) MCG/ACT inhaler 8 puff (8 puffs Inhalation Given 01/21/20 0940)    ED Course  I have reviewed the triage vital signs and the nursing notes.  Pertinent labs & imaging results that were available during my care of the patient were reviewed by me and considered in my medical decision making (see chart for details).  Clinical Course as of Jan 20 1654  Mon Jan 21, 2020  0919 Patient rechecked. Contines to have significant  wheezing. Will re-dose albuterol   [AH]    Clinical Course User Index [AH] Margarita Mail, PA-C   MDM Rules/Calculators/A&P                      CC:sob/asthma  VS:  Vitals:   01/21/20 0622 01/21/20 0822 01/21/20 0836 01/21/20 0941  BP:  131/83  133/88  Pulse:  87  88  Resp:  18   18  Temp:      TempSrc:      SpO2:  98% 100% 100%  Weight: 88.9 kg     Height: 5\' 2"  (1.575 m)        Leah Lamb presents to the ED with asthma  The presentation of Mckynna Vanloan is not consistent with cardiac wheeze, congestive heart failure, pneumothorax, pulmonary emboli, or other emergent process.  Additionally, Arieonna Medine has no evidence of of  pneumonia, sepsis, or other indication for antibiotics.  Upon discharge, Tamorah Hada has no evidence of respiratory failure or signs of tiring, and is comfortable without respiratory distress. Oxygen saturations remain above 90%. Einar Crow meets outpatient treatment criteria.  The patient has improved remarkably in the with treatment. She feels improved. Will dc with steroid burst. Suggest close OP follow up.  Data Reviewed/Counseling: I have reviewed the patient's vital signs, nursing notes, and other relevant tests/information. I had a detailed discussion regarding the historical points, exam findings, and any diagnostic results supporting the discharge diagnosis. I also discussed the need for outpatient follow-up and the need to return to the ED if symptoms worsen or if there are any questions or concerns that arise at home.    Final Clinical Impression(s) / ED Diagnoses Final diagnoses:  Moderate persistent asthma with exacerbation    Rx / DC Orders ED Discharge Orders         Ordered    methylPREDNISolone (MEDROL DOSEPAK) 4 MG TBPK tablet     01/21/20 1055    albuterol (VENTOLIN HFA) 108 (90 Base) MCG/ACT inhaler  Every 4 hours PRN     01/21/20 1055    levocetirizine (XYZAL) 5 MG tablet  Every evening     01/21/20 1055           Arthor Captain, PA-C 01/21/20 1655    Glynn Octave, MD 01/21/20 1714

## 2020-01-21 NOTE — ED Triage Notes (Signed)
Pt reports having flair of asthma due to cat and was taken off of prednisone by allergist and stared on Montalucas appx 5 days ago. Pt reports worsening symptoms and ran out of rescue inhaler and nebulizer machine not working.

## 2020-01-22 ENCOUNTER — Telehealth: Payer: Self-pay | Admitting: *Deleted

## 2020-01-22 ENCOUNTER — Other Ambulatory Visit: Payer: Self-pay | Admitting: *Deleted

## 2020-01-22 MED ORDER — ALBUTEROL SULFATE (2.5 MG/3ML) 0.083% IN NEBU: 2.5000 mg | INHALATION_SOLUTION | RESPIRATORY_TRACT | 1 refills | Status: DC | PRN

## 2020-01-22 NOTE — Addendum Note (Signed)
Addended by: Teressa Senter on: 01/22/2020 05:58 PM   Modules accepted: Orders

## 2020-01-22 NOTE — Telephone Encounter (Signed)
Yes go ahead and send in albuterol vials 1 vial every 4-6 hrs as needed

## 2020-01-22 NOTE — Telephone Encounter (Signed)
Received a fax from pharmacy asking for a prescription for Albuterol Nebulizer Medication. Please advise if you would like for the patient to have this medication. Thank You.

## 2020-01-31 ENCOUNTER — Other Ambulatory Visit: Payer: Self-pay

## 2020-01-31 MED ORDER — ALBUTEROL SULFATE (2.5 MG/3ML) 0.083% IN NEBU: 2.5000 mg | INHALATION_SOLUTION | RESPIRATORY_TRACT | 0 refills | Status: DC | PRN

## 2020-04-11 ENCOUNTER — Ambulatory Visit: Payer: Managed Care, Other (non HMO) | Admitting: Allergy

## 2020-05-21 ENCOUNTER — Other Ambulatory Visit: Payer: Self-pay

## 2020-05-21 ENCOUNTER — Encounter (HOSPITAL_COMMUNITY): Payer: Self-pay

## 2020-05-21 ENCOUNTER — Emergency Department (HOSPITAL_COMMUNITY)
Admission: EM | Admit: 2020-05-21 | Discharge: 2020-05-21 | Disposition: A | Discharge status: Home / Self Care | Payer: Managed Care, Other (non HMO) | Attending: Emergency Medicine | Admitting: Emergency Medicine

## 2020-05-21 DIAGNOSIS — N938 Other specified abnormal uterine and vaginal bleeding: Secondary | ICD-10-CM | POA: Diagnosis present

## 2020-05-21 DIAGNOSIS — Z5321 Procedure and treatment not carried out due to patient leaving prior to being seen by health care provider: Secondary | ICD-10-CM | POA: Insufficient documentation

## 2020-05-21 NOTE — ED Triage Notes (Signed)
Pt presents with c/o irregular vaginal bleeding. Pt reports that her last period ended 10 days ago but then she started bleeding approx 2 days ago again. Pt reports the bleeding started for the second time after having intercourse. Pt reports a hx of ovarian cyst where she had her left ovary removed. Pt reports she is having pain on the right side at this time similar to the pain she had on the left side when she had the ovarian cyst.

## 2020-05-23 ENCOUNTER — Other Ambulatory Visit: Payer: Self-pay | Admitting: Obstetrics & Gynecology

## 2020-05-27 ENCOUNTER — Other Ambulatory Visit: Payer: Self-pay | Admitting: Obstetrics & Gynecology

## 2020-05-27 DIAGNOSIS — R102 Pelvic and perineal pain: Secondary | ICD-10-CM

## 2020-05-29 ENCOUNTER — Other Ambulatory Visit: Payer: Managed Care, Other (non HMO)

## 2020-06-06 ENCOUNTER — Ambulatory Visit: Payer: Managed Care, Other (non HMO)

## 2020-06-12 ENCOUNTER — Emergency Department (HOSPITAL_BASED_OUTPATIENT_CLINIC_OR_DEPARTMENT_OTHER)
Admission: EM | Admit: 2020-06-12 | Discharge: 2020-06-12 | Disposition: A | Discharge status: Home / Self Care | Payer: Managed Care, Other (non HMO) | Attending: Emergency Medicine | Admitting: Emergency Medicine

## 2020-06-12 ENCOUNTER — Other Ambulatory Visit: Payer: Self-pay

## 2020-06-12 ENCOUNTER — Encounter (HOSPITAL_BASED_OUTPATIENT_CLINIC_OR_DEPARTMENT_OTHER): Payer: Self-pay | Admitting: Emergency Medicine

## 2020-06-12 ENCOUNTER — Telehealth: Payer: Self-pay

## 2020-06-12 DIAGNOSIS — J4521 Mild intermittent asthma with (acute) exacerbation: Secondary | ICD-10-CM | POA: Insufficient documentation

## 2020-06-12 DIAGNOSIS — J45909 Unspecified asthma, uncomplicated: Secondary | ICD-10-CM | POA: Insufficient documentation

## 2020-06-12 DIAGNOSIS — Z7951 Long term (current) use of inhaled steroids: Secondary | ICD-10-CM | POA: Diagnosis not present

## 2020-06-12 DIAGNOSIS — R0602 Shortness of breath: Secondary | ICD-10-CM | POA: Diagnosis present

## 2020-06-12 MED ORDER — PREDNISONE 50 MG PO TABS
60.0000 mg | ORAL_TABLET | Freq: Once | ORAL | Status: AC
  Administered 2020-06-12: 60 mg via ORAL
  Filled 2020-06-12: qty 1

## 2020-06-12 MED ORDER — ALBUTEROL SULFATE HFA 108 (90 BASE) MCG/ACT IN AERS: 1.0000 | INHALATION_SPRAY | Freq: Four times a day (QID) | RESPIRATORY_TRACT | 0 refills | Status: DC | PRN

## 2020-06-12 MED ORDER — PREDNISONE 10 MG PO TABS
40.0000 mg | ORAL_TABLET | Freq: Every day | ORAL | 0 refills | Status: AC
Start: 2020-06-12 — End: 2020-06-16

## 2020-06-12 MED ORDER — DULERA 200-5 MCG/ACT IN AERO: 2.0000 | INHALATION_SPRAY | Freq: Two times a day (BID) | RESPIRATORY_TRACT | 5 refills | Status: DC

## 2020-06-12 MED ORDER — CETIRIZINE HCL 10 MG PO TABS: 10.0000 mg | ORAL_TABLET | Freq: Every day | ORAL | 0 refills | Status: DC

## 2020-06-12 MED ORDER — ALBUTEROL SULFATE HFA 108 (90 BASE) MCG/ACT IN AERS
8.0000 | INHALATION_SPRAY | Freq: Once | RESPIRATORY_TRACT | Status: AC
  Administered 2020-06-12: 8 via RESPIRATORY_TRACT
  Filled 2020-06-12: qty 6.7

## 2020-06-12 NOTE — ED Triage Notes (Signed)
Pt reports being out of inhaler

## 2020-06-12 NOTE — ED Triage Notes (Signed)
Pt present with c/o shob and wheezing.

## 2020-06-12 NOTE — Telephone Encounter (Signed)
Ok to change to [employer] 2 puffs twice a day

## 2020-06-12 NOTE — Telephone Encounter (Signed)
Called patient to let her know that we were switching her to [facility] due to her insurance but patient did not answer. Left message  for patient to callback.

## 2020-06-12 NOTE — ED Notes (Signed)
ED Provider at bedside. 

## 2020-06-12 NOTE — ED Provider Notes (Signed)
[facility] EMERGENCY DEPARTMENT Provider Note   CSN: [id number] Arrival date & time: 06/12/20  6578     History Chief Complaint  Patient presents with  . Asthma    Leah Lamb is a 24 y.o. female.  HPI     Presents with symptoms consistent with prior asthma, currently out of inhaler.  Has to force cough, not productive Just started last night No fever, congestion, body aches Feeling of tighteness and pressure like asthma, wheezing Did not have inhaler Now after getting inhaler here in ED is feeling better but not back to baseline Called pharmacy to get refill of inhaler but was told it was no longer covered Supposed to be on montelukast but hasn't refilled it yet  No leg pain or swelling Not currently on OCPs  Past Medical History:  Diagnosis Date  . Asthma     There are no problems to display for this patient.   Past Surgical History:  Procedure Laterality Date  . OOPHORECTOMY Left      OB History   No obstetric history on file.     Family History  Problem Relation Age of Onset  . Asthma Neg Hx   . Allergic rhinitis Neg Hx   . Eczema Neg Hx     Social History   Tobacco Use  . Smoking status: Never Smoker  . Smokeless tobacco: Never Used  Substance Use Topics  . Alcohol use: Never  . Drug use: Never    Home Medications Prior to Admission medications   Medication Sig Start Date End Date Taking? Authorizing Provider  albuterol (PROVENTIL) (2.5 MG/3ML) 0.083% nebulizer solution Take 3 mLs (2.5 mg total) by nebulization every 6 (six) hours as needed for wheezing or shortness of breath. 01/11/20   Marcelyn Bruins, MD  albuterol (PROVENTIL) (2.5 MG/3ML) 0.083% nebulizer solution Take 3 mLs (2.5 mg total) by nebulization every 4 (four) hours as needed. 01/31/20   Marcelyn Bruins, MD  albuterol (VENTOLIN HFA) 108 (90 Base) MCG/ACT inhaler Inhale 1-2 puffs into the lungs every 6 (six) hours as needed for wheezing or  shortness of breath. 06/12/20   Alvira Monday, MD  budesonide-formoterol (SYMBICORT) 160-4.5 MCG/ACT inhaler Inhale 2 puffs into the lungs 2 (two) times daily. 01/11/20   Marcelyn Bruins, MD  cetirizine (ZYRTEC) 10 MG tablet Take 1 tablet (10 mg total) by mouth daily for 14 days. 06/12/20 06/26/20  Alvira Monday, MD  hydrOXYzine (ATARAX/VISTARIL) 25 MG tablet Take 1 tablet (25 mg total) by mouth every 6 (six) hours as needed for itching (allergy symptoms). 01/04/20   Long, Arlyss Repress, MD  levocetirizine (XYZAL) 5 MG tablet Take 1 tablet (5 mg total) by mouth every evening. 01/21/20   Arthor Captain, PA-C  methylPREDNISolone (MEDROL DOSEPAK) 4 MG TBPK tablet Use as directed 01/21/20   Arthor Captain, PA-C  mometasone-formoterol (DULERA) 200-5 MCG/ACT AERO Inhale 2 puffs into the lungs 2 (two) times daily. 06/12/20   Marcelyn Bruins, MD  montelukast (SINGULAIR) 10 MG tablet Take 1 tablet (10 mg total) by mouth at bedtime. 01/11/20   Marcelyn Bruins, MD  predniSONE (DELTASONE) 10 MG tablet Take 4 tablets (40 mg total) by mouth daily for 4 days. 06/12/20 06/16/20  Alvira Monday, MD    Allergies    Amoxicillin and Motrin [ibuprofen]  Review of Systems   Review of Systems  Constitutional: Negative for fever.  HENT: Negative for sore throat.   Eyes: Negative for visual disturbance.  Respiratory: Positive  for cough, chest tightness, shortness of breath and wheezing.   Cardiovascular: Negative for chest pain (tightness).  Gastrointestinal: Negative for abdominal pain, nausea and vomiting.  Genitourinary: Negative for difficulty urinating.  Musculoskeletal: Positive for back pain. Negative for neck pain.  Skin: Negative for rash.  Neurological: Negative for syncope and headaches.    Physical Exam Updated Vital Signs BP 129/79 (BP Location: Right Arm)   Pulse 91   Temp 98.7 F (37.1 C)   Resp (!) 24   Ht 5\' 2"  (1.575 m)   Wt 90.7 kg   LMP 05/19/2020 (Approximate)    SpO2 95%   BMI 36.58 kg/m   Physical Exam Vitals and nursing note reviewed.  Constitutional:      General: She is not in acute distress.    Appearance: She is well-developed. She is not diaphoretic.  HENT:     Head: Normocephalic and atraumatic.  Eyes:     Conjunctiva/sclera: Conjunctivae normal.  Cardiovascular:     Rate and Rhythm: Normal rate and regular rhythm.     Heart sounds: Normal heart sounds. No murmur heard.  No friction rub. No gallop.   Pulmonary:     Effort: Pulmonary effort is normal. No respiratory distress.     Breath sounds: Wheezing (end expiratory) present. No rales.  Abdominal:     General: There is no distension.     Palpations: Abdomen is soft.     Tenderness: There is no abdominal tenderness. There is no guarding.  Musculoskeletal:        General: No tenderness.     Cervical back: Normal range of motion.  Skin:    General: Skin is warm and dry.     Findings: No erythema or rash.  Neurological:     Mental Status: She is alert and oriented to person, place, and time.     ED Results / Procedures / Treatments   Labs (all labs ordered are listed, but only abnormal results are displayed) Labs Reviewed - No data to display  EKG None  Radiology No results found.  Procedures Procedures (including critical care time)  Medications Ordered in ED Medications  albuterol (VENTOLIN HFA) 108 (90 Base) MCG/ACT inhaler 8 puff (8 puffs Inhalation Given 06/12/20 0644)  predniSONE (DELTASONE) tablet 60 mg (60 mg Oral Given 06/12/20 06/14/20)    ED Course  I have reviewed the triage vital signs and the nursing notes.  Pertinent labs & imaging results that were available during my care of the patient were reviewed by me and considered in my medical decision making (see chart for details).    MDM Rules/Calculators/A&P                          24yo female presents with concern for shortness of breath and wheezing.  History and exam consistent with asthma  exacerbation. Doubt PE given no asymmetric leg swelling, no hypoxia, no risk factors. Breath sounds bilaterally, no sign of pneumothorax or pneumonia by exam.  Given albuterol with improvement and rx for same.  Given prednisone in ED and rx for asthma exacerbation. Patient discharged in stable condition with understanding of reasons to return.    Final Clinical Impression(s) / ED Diagnoses Final diagnoses:  Mild intermittent asthma with exacerbation    Rx / DC Orders ED Discharge Orders         Ordered    albuterol (VENTOLIN HFA) 108 (90 Base) MCG/ACT inhaler  Every 6 hours PRN,  Status:  Discontinued     Reprint     06/12/20 0744    predniSONE (DELTASONE) 10 MG tablet  Daily     Discontinue  Reprint     06/12/20 0744    cetirizine (ZYRTEC) 10 MG tablet  Daily     Discontinue  Reprint     06/12/20 0744    albuterol (VENTOLIN HFA) 108 (90 Base) MCG/ACT inhaler  Every 6 hours PRN     Discontinue  Reprint     06/12/20 0747           Gareth Morgan, MD 06/12/20 2215

## 2020-06-12 NOTE — Telephone Encounter (Signed)
Received notification from patient's pharmacy stating the symbicort is not covered by insurance and she would need to try one of the alternatives: fluticasone-salmeterol, dulera, advair (wixela). Please advise.

## 2020-06-16 NOTE — Telephone Encounter (Signed)
2nd call, patient answered and hung up.

## 2020-06-16 NOTE — Telephone Encounter (Signed)
3rd attempt made and patient hung up again. 4th call was made and left a detailed message about her inhaler switch.

## 2020-07-04 ENCOUNTER — Other Ambulatory Visit: Payer: Managed Care, Other (non HMO)

## 2020-07-11 ENCOUNTER — Other Ambulatory Visit: Payer: Managed Care, Other (non HMO)

## 2020-08-23 ENCOUNTER — Emergency Department (HOSPITAL_BASED_OUTPATIENT_CLINIC_OR_DEPARTMENT_OTHER)
Admission: EM | Admit: 2020-08-23 | Discharge: 2020-08-23 | Disposition: A | Discharge status: Home / Self Care | Payer: Managed Care, Other (non HMO) | Attending: Emergency Medicine | Admitting: Emergency Medicine

## 2020-08-23 ENCOUNTER — Encounter (HOSPITAL_BASED_OUTPATIENT_CLINIC_OR_DEPARTMENT_OTHER): Payer: Self-pay | Admitting: Emergency Medicine

## 2020-08-23 ENCOUNTER — Other Ambulatory Visit: Payer: Self-pay

## 2020-08-23 DIAGNOSIS — Z20822 Contact with and (suspected) exposure to covid-19: Secondary | ICD-10-CM | POA: Insufficient documentation

## 2020-08-23 DIAGNOSIS — J45901 Unspecified asthma with (acute) exacerbation: Secondary | ICD-10-CM | POA: Insufficient documentation

## 2020-08-23 DIAGNOSIS — Z79899 Other long term (current) drug therapy: Secondary | ICD-10-CM | POA: Insufficient documentation

## 2020-08-23 HISTORY — DX: Anemia, unspecified: D64.9

## 2020-08-23 LAB — SARS CORONAVIRUS 2 BY RT PCR (HOSPITAL ORDER, PERFORMED IN ~~LOC~~ HOSPITAL LAB): SARS Coronavirus 2: NEGATIVE

## 2020-08-23 MED ORDER — ALBUTEROL SULFATE HFA 108 (90 BASE) MCG/ACT IN AERS
2.0000 | INHALATION_SPRAY | Freq: Once | RESPIRATORY_TRACT | Status: AC
  Administered 2020-08-23: 2 via RESPIRATORY_TRACT
  Filled 2020-08-23: qty 6.7

## 2020-08-23 MED ORDER — ALBUTEROL SULFATE HFA 108 (90 BASE) MCG/ACT IN AERS: 1.0000 | INHALATION_SPRAY | Freq: Four times a day (QID) | RESPIRATORY_TRACT | 0 refills | Status: DC | PRN

## 2020-08-23 MED ORDER — PREDNISONE 20 MG PO TABS
40.0000 mg | ORAL_TABLET | Freq: Every day | ORAL | 0 refills | Status: AC
Start: 2020-08-23 — End: 2020-08-28

## 2020-08-23 NOTE — ED Triage Notes (Signed)
SOB x 3 days. Hx of asthma. Does not have inhaler due to insurance.

## 2020-08-23 NOTE — ED Notes (Signed)
Pharmacy and medications updated with patient 

## 2020-08-23 NOTE — ED Notes (Signed)
Pt discharged to home. Discharge instructions have been discussed with patient and/or family members. Pt verbally acknowledges understanding d/c instructions, and endorses comprehension to checkout at registration before leaving.  °

## 2020-08-23 NOTE — Discharge Instructions (Signed)
Take your albuterol inhaler as needed for wheezing.  Recommend Tylenol, Motrin as needed for body aches or fever.  If you develop worsening difficulty breathing, fever, chest pain, other new concerning symptom, recommend return to ER for reassessment.

## 2020-08-24 NOTE — ED Provider Notes (Signed)
[facility] EMERGENCY DEPARTMENT Provider Note   CSN: [id number] Arrival date & time: 08/23/20  1059     History Chief Complaint  Patient presents with  . Shortness of Breath    Leah Lamb is a 24 y.o. female.  Presents to ER with concern for shortness of breath.  She reports that over the past couple days she has had worsening difficulty breathing, wheezing.  Reports she has a history of asthma but no longer has an inhaler.  Also having mild cough, nonproductive.  No fevers.  No associated chest pain.  No syncope or near syncope.  Not on estrogen therapy, no history of DVT/PE.  HPI     Past Medical History:  Diagnosis Date  . Anemia   . Asthma     There are no problems to display for this patient.   Past Surgical History:  Procedure Laterality Date  . OOPHORECTOMY Left      OB History   No obstetric history on file.     Family History  Problem Relation Age of Onset  . Asthma Neg Hx   . Allergic rhinitis Neg Hx   . Eczema Neg Hx     Social History   Tobacco Use  . Smoking status: Never Smoker  . Smokeless tobacco: Never Used  Substance Use Topics  . Alcohol use: Never  . Drug use: Never    Home Medications Prior to Admission medications   Medication Sig Start Date End Date Taking? Authorizing Provider  albuterol (PROVENTIL) (2.5 MG/3ML) 0.083% nebulizer solution Take 3 mLs (2.5 mg total) by nebulization every 6 (six) hours as needed for wheezing or shortness of breath. 01/11/20   Marcelyn Bruins, MD  albuterol (PROVENTIL) (2.5 MG/3ML) 0.083% nebulizer solution Take 3 mLs (2.5 mg total) by nebulization every 4 (four) hours as needed. 01/31/20   Marcelyn Bruins, MD  albuterol (VENTOLIN HFA) 108 (90 Base) MCG/ACT inhaler Inhale 1-2 puffs into the lungs every 6 (six) hours as needed for wheezing or shortness of breath. 08/23/20   Milagros Loll, MD  budesonide-formoterol (SYMBICORT) 160-4.5 MCG/ACT inhaler Inhale 2 puffs  into the lungs 2 (two) times daily. 01/11/20   Marcelyn Bruins, MD  cetirizine (ZYRTEC) 10 MG tablet Take 1 tablet (10 mg total) by mouth daily for 14 days. 06/12/20 06/26/20  Alvira Monday, MD  hydrOXYzine (ATARAX/VISTARIL) 25 MG tablet Take 1 tablet (25 mg total) by mouth every 6 (six) hours as needed for itching (allergy symptoms). 01/04/20   Long, Arlyss Repress, MD  levocetirizine (XYZAL) 5 MG tablet Take 1 tablet (5 mg total) by mouth every evening. 01/21/20   Arthor Captain, PA-C  methylPREDNISolone (MEDROL DOSEPAK) 4 MG TBPK tablet Use as directed 01/21/20   Arthor Captain, PA-C  mometasone-formoterol (DULERA) 200-5 MCG/ACT AERO Inhale 2 puffs into the lungs 2 (two) times daily. 06/12/20   Marcelyn Bruins, MD  montelukast (SINGULAIR) 10 MG tablet Take 1 tablet (10 mg total) by mouth at bedtime. 01/11/20   Marcelyn Bruins, MD  predniSONE (DELTASONE) 20 MG tablet Take 2 tablets (40 mg total) by mouth daily for 5 days. 08/23/20 08/28/20  Milagros Loll, MD    Allergies    Amoxicillin and Motrin [ibuprofen]  Review of Systems   Review of Systems  Constitutional: Negative for chills and fever.  HENT: Negative for ear pain and sore throat.   Eyes: Negative for pain and visual disturbance.  Respiratory: Positive for shortness of breath and wheezing.  Negative for cough.   Cardiovascular: Negative for chest pain and palpitations.  Gastrointestinal: Negative for abdominal pain and vomiting.  Genitourinary: Negative for dysuria and hematuria.  Musculoskeletal: Negative for arthralgias and back pain.  Skin: Negative for color change and rash.  Neurological: Negative for seizures and syncope.  All other systems reviewed and are negative.   Physical Exam Updated Vital Signs BP 126/77 (BP Location: Right Arm)   Pulse 80   Temp 98.7 F (37.1 C) (Oral)   Resp 18   Ht 5' (1.524 m)   Wt 90.7 kg   LMP 08/09/2020   SpO2 100%   BMI 39.06 kg/m   Physical Exam Vitals  and nursing note reviewed.  Constitutional:      General: She is not in acute distress.    Appearance: She is well-developed.  HENT:     Head: Normocephalic and atraumatic.  Eyes:     Conjunctiva/sclera: Conjunctivae normal.  Cardiovascular:     Rate and Rhythm: Normal rate and regular rhythm.     Heart sounds: No murmur heard.   Pulmonary:     Comments: Speaks in full sentences, no respiratory distress, noted bilateral expiratory wheeze Abdominal:     Palpations: Abdomen is soft.     Tenderness: There is no abdominal tenderness.  Musculoskeletal:     Cervical back: Neck supple.  Skin:    General: Skin is warm and dry.  Neurological:     Mental Status: She is alert.     ED Results / Procedures / Treatments   Labs (all labs ordered are listed, but only abnormal results are displayed) Labs Reviewed  SARS CORONAVIRUS 2 BY RT PCR (HOSPITAL ORDER, PERFORMED IN [facility])    EKG None  Radiology No results found.  Procedures Procedures (including critical care time)  Medications Ordered in ED Medications  albuterol (VENTOLIN HFA) 108 (90 Base) MCG/ACT inhaler 2 puff (2 puffs Inhalation Given 08/23/20 1256)    ED Course  I have reviewed the triage vital signs and the nursing notes.  Pertinent labs & imaging results that were available during my care of the patient were reviewed by me and considered in my medical decision making (see chart for details).    MDM Rules/Calculators/A&P                         24 year old lady presents to ER with concern for shortness of breath.  History of asthma, noted expiratory wheeze on exam.  She is otherwise well-appearing, not in any respiratory distress.  No crackles, no hypoxia, no fever to suggest pneumonia.  No hypoxia, no tachycardia, no tachypnea, very low suspicion for acute PE.  Symptoms improved with albuterol.  Recommend course of steroids, as needed albuterol.  Recommended close follow-up with primary doctor.   Reviewed return precautions and discharged home.    After the discussed management above, the patient was determined to be safe for discharge.  The patient was in agreement with this plan and all questions regarding their care were answered.  ED return precautions were discussed and the patient will return to the ED with any significant worsening of condition.  Final Clinical Impression(s) / ED Diagnoses Final diagnoses:  Asthma with acute exacerbation, unspecified asthma severity, unspecified whether persistent    Rx / DC Orders ED Discharge Orders         Ordered    predniSONE (DELTASONE) 20 MG tablet  Daily  08/23/20 1255    albuterol (VENTOLIN HFA) 108 (90 Base) MCG/ACT inhaler  Every 6 hours PRN        08/23/20 1255           Milagros Loll, MD 08/24/20 1040

## 2020-09-02 ENCOUNTER — Emergency Department (HOSPITAL_BASED_OUTPATIENT_CLINIC_OR_DEPARTMENT_OTHER)
Admission: EM | Admit: 2020-09-02 | Discharge: 2020-09-02 | Disposition: A | Discharge status: Home / Self Care | Payer: Self-pay | Attending: Emergency Medicine | Admitting: Emergency Medicine

## 2020-09-02 ENCOUNTER — Encounter (HOSPITAL_BASED_OUTPATIENT_CLINIC_OR_DEPARTMENT_OTHER): Payer: Self-pay

## 2020-09-02 ENCOUNTER — Other Ambulatory Visit: Payer: Self-pay

## 2020-09-02 DIAGNOSIS — J4521 Mild intermittent asthma with (acute) exacerbation: Secondary | ICD-10-CM | POA: Insufficient documentation

## 2020-09-02 DIAGNOSIS — Z7951 Long term (current) use of inhaled steroids: Secondary | ICD-10-CM | POA: Insufficient documentation

## 2020-09-02 DIAGNOSIS — Z20822 Contact with and (suspected) exposure to covid-19: Secondary | ICD-10-CM | POA: Insufficient documentation

## 2020-09-02 DIAGNOSIS — Z79899 Other long term (current) drug therapy: Secondary | ICD-10-CM | POA: Insufficient documentation

## 2020-09-02 DIAGNOSIS — Z7952 Long term (current) use of systemic steroids: Secondary | ICD-10-CM | POA: Insufficient documentation

## 2020-09-02 LAB — SARS CORONAVIRUS 2 BY RT PCR (HOSPITAL ORDER, PERFORMED IN ~~LOC~~ HOSPITAL LAB): SARS Coronavirus 2: NEGATIVE

## 2020-09-02 MED ORDER — PREDNISONE 20 MG PO TABS
40.0000 mg | ORAL_TABLET | Freq: Every day | ORAL | 0 refills | Status: AC
Start: 2020-09-02 — End: 2020-09-07

## 2020-09-02 MED ORDER — METHYLPREDNISOLONE SODIUM SUCC 125 MG IJ SOLR
80.0000 mg | Freq: Once | INTRAMUSCULAR | Status: AC
  Administered 2020-09-02: 80 mg via INTRAVENOUS
  Filled 2020-09-02: qty 2

## 2020-09-02 MED ORDER — ALBUTEROL SULFATE HFA 108 (90 BASE) MCG/ACT IN AERS
1.0000 | INHALATION_SPRAY | RESPIRATORY_TRACT | Status: DC | PRN
  Administered 2020-09-02: 2 via RESPIRATORY_TRACT
  Filled 2020-09-02: qty 6.7

## 2020-09-02 NOTE — Discharge Instructions (Signed)
You have been seen here for asthma exacerbation.  I have given you steroids and an albuterol inhaler,  please use as prescribed.  I recommend for your seasonal allergies to continue using your nasal spray as well as using Claritin as this can help with your allergy symptoms.  I want you to follow-up with your primary care doctor for further evaluation management.  I have also given the contact information for community health and wellness. they work with individuals with little to no insurance and help you find a primary care provider.  I want to come back to the emergency department if you develop shortness of breath, chest pain, severe abdominal pain, uncontrolled nausea, vomiting, diarrhea as these symptoms require further evaluation management.

## 2020-09-02 NOTE — ED Provider Notes (Signed)
[facility] EMERGENCY DEPARTMENT Provider Note   CSN: [id number] Arrival date & time: 09/02/20  0856     History Chief Complaint  Patient presents with  . Shortness of Breath    Leah Lamb is a 24 y.o. female.  HPI   Patient presents with a significant medical history of anemia and asthma to the emergency department with chief complaint of shortness of breath dyspnea for the last 2 weeks.  Patient states she has been cat Sitting for the last month and has allergies to cats.  She also explains that she has some mold growing in her house which she feels is making her asthma worse.  She states she has increased wheezing especially at nighttime.  She has been taking her steroid inhaler as well as her rescue inhaler without any relief.  She denies fever, chills, chest pain, leg pain, leg swelling, history of DVTs or PEs.  She has never been hospitalized for asthma exacerbation and generally receives steroids which helps her feel better.  Patient is not Covid vaccinated but denies any recent sick contacts.  She denies headache, fever, chills, cough, congestion, sore throat, chest pain, abdominal pain, nausea, vomiting, diarrhea, pedal edema.  Past Medical History:  Diagnosis Date  . Anemia   . Asthma     There are no problems to display for this patient.   Past Surgical History:  Procedure Laterality Date  . OOPHORECTOMY Left      OB History   No obstetric history on file.     Family History  Problem Relation Age of Onset  . Asthma Neg Hx   . Allergic rhinitis Neg Hx   . Eczema Neg Hx     Social History   Tobacco Use  . Smoking status: Never Smoker  . Smokeless tobacco: Never Used  Substance Use Topics  . Alcohol use: Never  . Drug use: Never    Home Medications Prior to Admission medications   Medication Sig Start Date End Date Taking? Authorizing Provider  albuterol (PROVENTIL) (2.5 MG/3ML) 0.083% nebulizer solution Take 3 mLs (2.5 mg total) by  nebulization every 6 (six) hours as needed for wheezing or shortness of breath. 01/11/20   Marcelyn Bruins, MD  albuterol (PROVENTIL) (2.5 MG/3ML) 0.083% nebulizer solution Take 3 mLs (2.5 mg total) by nebulization every 4 (four) hours as needed. 01/31/20   Marcelyn Bruins, MD  albuterol (VENTOLIN HFA) 108 (90 Base) MCG/ACT inhaler Inhale 1-2 puffs into the lungs every 6 (six) hours as needed for wheezing or shortness of breath. 08/23/20   Milagros Loll, MD  budesonide-formoterol (SYMBICORT) 160-4.5 MCG/ACT inhaler Inhale 2 puffs into the lungs 2 (two) times daily. 01/11/20   Marcelyn Bruins, MD  cetirizine (ZYRTEC) 10 MG tablet Take 1 tablet (10 mg total) by mouth daily for 14 days. 06/12/20 06/26/20  Alvira Monday, MD  hydrOXYzine (ATARAX/VISTARIL) 25 MG tablet Take 1 tablet (25 mg total) by mouth every 6 (six) hours as needed for itching (allergy symptoms). 01/04/20   Long, Arlyss Repress, MD  levocetirizine (XYZAL) 5 MG tablet Take 1 tablet (5 mg total) by mouth every evening. 01/21/20   Arthor Captain, PA-C  methylPREDNISolone (MEDROL DOSEPAK) 4 MG TBPK tablet Use as directed 01/21/20   Arthor Captain, PA-C  mometasone-formoterol (DULERA) 200-5 MCG/ACT AERO Inhale 2 puffs into the lungs 2 (two) times daily. 06/12/20   Marcelyn Bruins, MD  montelukast (SINGULAIR) 10 MG tablet Take 1 tablet (10 mg total) by mouth  at bedtime. 01/11/20   Marcelyn Bruins, MD  predniSONE (DELTASONE) 20 MG tablet Take 2 tablets (40 mg total) by mouth daily for 5 days. 09/02/20 09/07/20  Carroll Sage, PA-C    Allergies    Amoxicillin and Motrin [ibuprofen]  Review of Systems   Review of Systems  Constitutional: Negative for chills and fever.  HENT: Negative for congestion, sore throat, tinnitus and trouble swallowing.   Eyes: Negative for visual disturbance.  Respiratory: Positive for shortness of breath and wheezing. Negative for chest tightness.   Cardiovascular:  Negative for chest pain.  Gastrointestinal: Negative for abdominal pain, diarrhea, nausea and vomiting.  Genitourinary: Negative for enuresis and flank pain.  Musculoskeletal: Negative for back pain.  Skin: Negative for rash.  Neurological: Negative for dizziness and headaches.  Hematological: Does not bruise/bleed easily.    Physical Exam Updated Vital Signs BP 124/78 (BP Location: Right Arm)   Pulse 86   Temp 98.3 F (36.8 C) (Oral)   Resp 16   Ht 5' (1.524 m)   Wt 90.7 kg   LMP 07/29/2020   SpO2 99%   BMI 39.06 kg/m   Physical Exam Vitals and nursing note reviewed.  Constitutional:      General: She is not in acute distress.    Appearance: She is not ill-appearing.  HENT:     Head: Normocephalic and atraumatic.     Nose: No congestion.     Mouth/Throat:     Mouth: Mucous membranes are moist.     Pharynx: Oropharynx is clear. No oropharyngeal exudate or posterior oropharyngeal erythema.  Eyes:     General: No scleral icterus. Cardiovascular:     Rate and Rhythm: Normal rate and regular rhythm.     Pulses: Normal pulses.     Heart sounds: No murmur heard.  No friction rub. No gallop.   Pulmonary:     Effort: No respiratory distress.     Breath sounds: Wheezing present. No rhonchi or rales.  Abdominal:     General: There is no distension.     Tenderness: There is no abdominal tenderness. There is no right CVA tenderness, left CVA tenderness or guarding.  Musculoskeletal:        General: No swelling or tenderness.     Right lower leg: No edema.     Left lower leg: No edema.  Skin:    General: Skin is warm and dry.     Findings: No rash.  Neurological:     Mental Status: She is alert.  Psychiatric:        Mood and Affect: Mood normal.     ED Results / Procedures / Treatments   Labs (all labs ordered are listed, but only abnormal results are displayed) Labs Reviewed  SARS CORONAVIRUS 2 BY RT PCR (HOSPITAL ORDER, PERFORMED IN [facility])     EKG None  Radiology No results found.  Procedures Procedures (including critical care time)  Medications Ordered in ED Medications  albuterol (VENTOLIN HFA) 108 (90 Base) MCG/ACT inhaler 1-2 puff (2 puffs Inhalation Given 09/02/20 0911)  methylPREDNISolone sodium succinate (SOLU-MEDROL) 125 mg/2 mL injection 80 mg (80 mg Intravenous Given 09/02/20 1004)    ED Course  I have reviewed the triage vital signs and the nursing notes.  Pertinent labs & imaging results that were available during my care of the patient were reviewed by me and considered in my medical decision making (see chart for details).    MDM Rules/Calculators/A&P  I have personally reviewed all imaging, labs and have interpreted them.  Patient presents with shortness of breath.  Patient was alert and oriented did not appear to be in acute distress.  Vital signs reassuring.  On exam patient's had bilateral wheezing heard in the lower lobes, no rhonchi, stridor or rales heard.  She was not in respiratory distress or respiratory failure, no nasal flaring or chest retractions noted, satting 100% room air nontachypneic.  Abdominal exam was nontender to palpation, no pedal edema noted.  Will provide patient with steroids and inhaler and reevaluate.  Patient was reassessed lung sounds are clear bilaterally, no wheezing heard.  Patient states she is feeling much better and states that her breathing feels a lot less labored.  I have low suspicion patient is suffering from a systemic infection as patient is nontoxic-appearing, vital signs reassuring, no obvious source of infection noted on exam.  Low suspicion for PE as she denies pleuritic chest pain, no pedal edema noted on exam, she was PERC negative.  Low suspicion for a cardiac abnormality as patient denies chest pain, there was no signs of hypoperfusion or fluid over noted on exam.  She also has very low risk factors for cardiac issues.  Due to  well-appearing patient and benign physical exam further lab and imaging were not warranted.  Patient appears to be resting comfortably showing no acute signs distress.  Vital signs remained stable does not meet criteria to admit to the hospital.  Likely patient suffered asthma exacerbation and she was given steroids as well as a rescue inhaler.  I recommend she follows up with her primary care doctor for further evaluation.  Patient was given at home care as well as strict return precautions.  Patient verbalized that she understood and agreed with said plan. Final Clinical Impression(s) / ED Diagnoses Final diagnoses:  Mild intermittent asthma with exacerbation    Rx / DC Orders ED Discharge Orders         Ordered    predniSONE (DELTASONE) 20 MG tablet  Daily        09/02/20 1029           Barnie Del 09/02/20 1226    Terrilee Files, MD 09/02/20 1956

## 2020-09-02 NOTE — ED Triage Notes (Signed)
Pt arrives with c/o increased SOB states she has been out of her inhaler for 2 days.

## 2020-09-02 NOTE — ED Notes (Signed)
Ambulated from lobby to room 10.  SpO2 99-100%, HR 100-105, BBS decreased with exp wheezes t/o.  Patient out of HFA 2 days.

## 2020-09-17 ENCOUNTER — Encounter (HOSPITAL_BASED_OUTPATIENT_CLINIC_OR_DEPARTMENT_OTHER): Payer: Self-pay | Admitting: Emergency Medicine

## 2020-09-17 ENCOUNTER — Other Ambulatory Visit: Payer: Self-pay

## 2020-09-17 ENCOUNTER — Emergency Department (HOSPITAL_BASED_OUTPATIENT_CLINIC_OR_DEPARTMENT_OTHER)
Admission: EM | Admit: 2020-09-17 | Discharge: 2020-09-17 | Disposition: A | Discharge status: Home / Self Care | Payer: Self-pay | Attending: Emergency Medicine | Admitting: Emergency Medicine

## 2020-09-17 DIAGNOSIS — J4541 Moderate persistent asthma with (acute) exacerbation: Secondary | ICD-10-CM | POA: Insufficient documentation

## 2020-09-17 HISTORY — DX: Obesity, unspecified: E66.9

## 2020-09-17 MED ORDER — PREDNISONE 50 MG PO TABS
60.0000 mg | ORAL_TABLET | Freq: Once | ORAL | Status: AC
  Administered 2020-09-17: 60 mg via ORAL
  Filled 2020-09-17: qty 1

## 2020-09-17 MED ORDER — PREDNISONE 10 MG PO TABS: 40.0000 mg | ORAL_TABLET | Freq: Every day | ORAL | 0 refills | Status: AC

## 2020-09-17 MED ORDER — ALBUTEROL SULFATE HFA 108 (90 BASE) MCG/ACT IN AERS
4.0000 | INHALATION_SPRAY | Freq: Once | RESPIRATORY_TRACT | Status: AC
  Administered 2020-09-17: 4 via RESPIRATORY_TRACT
  Filled 2020-09-17: qty 6.7

## 2020-09-17 MED ORDER — FLUTICASONE PROPIONATE HFA 44 MCG/ACT IN AERO
2.0000 | INHALATION_SPRAY | Freq: Once | RESPIRATORY_TRACT | Status: AC
  Administered 2020-09-17: 2 via RESPIRATORY_TRACT
  Filled 2020-09-17: qty 10.6

## 2020-09-17 MED FILL — predniSONE 10 MG TABS: 10 | 4 days supply | Qty: 16 | Fill #0

## 2020-09-17 NOTE — ED Notes (Signed)
Ambulated from lobby to room 8.  SpO2 99-100%, HR 100-105, BBS I/E wheezes t/o, out of HFA 3 days.

## 2020-09-17 NOTE — ED Provider Notes (Signed)
[facility] EMERGENCY DEPARTMENT Provider Note   CSN: [id number] Arrival date & time: 09/17/20  1022     History Chief Complaint  Patient presents with  . Shortness of Breath    Leah Lamb is a 24 y.o. female.  HPI      Allergies triggering asthma Last ngiht was worse, has been worse at nights Couldn't sleep due to symptoms Wheezing, shortness of breath feels like asthma No cough, only cough with tightness related to asthma No leg pain or swelling, chest pain, fevers, sore throat, nausea/vomiting Has had post nasal drip  Taking allergy medication, montelukast, was here 1.5 weeks ago, was on prednisone for 5 days and imprved but then it came back again Ran out of the inhaler, was using it more than 4 times a day  No long trips, recent surgeries, not on estrogen  Past Medical History:  Diagnosis Date  . Anemia   . Asthma   . Obesity     There are no problems to display for this patient.   Past Surgical History:  Procedure Laterality Date  . OOPHORECTOMY Left      OB History   No obstetric history on file.     Family History  Problem Relation Age of Onset  . Asthma Neg Hx   . Allergic rhinitis Neg Hx   . Eczema Neg Hx     Social History   Tobacco Use  . Smoking status: Never Smoker  . Smokeless tobacco: Never Used  Substance Use Topics  . Alcohol use: Never  . Drug use: Never    Home Medications Prior to Admission medications   Medication Sig Start Date End Date Taking? Authorizing Provider  albuterol (PROVENTIL) (2.5 MG/3ML) 0.083% nebulizer solution Take 3 mLs (2.5 mg total) by nebulization every 6 (six) hours as needed for wheezing or shortness of breath. 01/11/20   Marcelyn Bruins, MD  albuterol (PROVENTIL) (2.5 MG/3ML) 0.083% nebulizer solution Take 3 mLs (2.5 mg total) by nebulization every 4 (four) hours as needed. 01/31/20   Marcelyn Bruins, MD  albuterol (VENTOLIN HFA) 108 (90 Base) MCG/ACT inhaler  Inhale 1-2 puffs into the lungs every 6 (six) hours as needed for wheezing or shortness of breath. 08/23/20   Milagros Loll, MD  cetirizine (ZYRTEC) 10 MG tablet Take 1 tablet (10 mg total) by mouth daily for 14 days. 06/12/20 06/26/20  Alvira Monday, MD  hydrOXYzine (ATARAX/VISTARIL) 25 MG tablet Take 1 tablet (25 mg total) by mouth every 6 (six) hours as needed for itching (allergy symptoms). 01/04/20   Long, Arlyss Repress, MD  levocetirizine (XYZAL) 5 MG tablet Take 1 tablet (5 mg total) by mouth every evening. 01/21/20   Arthor Captain, PA-C  methylPREDNISolone (MEDROL DOSEPAK) 4 MG TBPK tablet Use as directed 01/21/20   Arthor Captain, PA-C  mometasone-formoterol (DULERA) 200-5 MCG/ACT AERO Inhale 2 puffs into the lungs 2 (two) times daily. 06/12/20   Marcelyn Bruins, MD  montelukast (SINGULAIR) 10 MG tablet Take 1 tablet (10 mg total) by mouth at bedtime. 01/11/20   Marcelyn Bruins, MD  predniSONE (DELTASONE) 10 MG tablet Take 4 tablets (40 mg total) by mouth daily for 4 days. 09/17/20 09/21/20  Alvira Monday, MD  budesonide-formoterol (SYMBICORT) 160-4.5 MCG/ACT inhaler Inhale 2 puffs into the lungs 2 (two) times daily. 01/11/20 09/17/20  Marcelyn Bruins, MD    Allergies    Amoxicillin and Motrin [ibuprofen]  Review of Systems   Review of Systems  Constitutional:  Negative for fever.  HENT: Negative for sore throat.   Eyes: Negative for visual disturbance.  Respiratory: Positive for shortness of breath. Negative for cough.   Cardiovascular: Negative for chest pain, palpitations and leg swelling.  Gastrointestinal: Negative for abdominal pain, nausea and vomiting.  Genitourinary: Negative for difficulty urinating.  Musculoskeletal: Negative for back pain and neck pain.  Skin: Negative for rash.  Neurological: Negative for syncope and headaches.    Physical Exam Updated Vital Signs BP 133/75 (BP Location: Right Arm)   Pulse 93   Temp 98.7 F (37.1 C)  (Oral)   Resp 18   Ht 5\' 2"  (1.575 m)   Wt 99.3 kg   LMP 08/29/2020   SpO2 100%   BMI 40.06 kg/m   Physical Exam Vitals and nursing note reviewed.  Constitutional:      General: She is not in acute distress.    Appearance: She is well-developed. She is not diaphoretic.  HENT:     Head: Normocephalic and atraumatic.  Eyes:     Conjunctiva/sclera: Conjunctivae normal.  Cardiovascular:     Rate and Rhythm: Normal rate and regular rhythm.     Heart sounds: Normal heart sounds. No murmur heard.  No friction rub. No gallop.   Pulmonary:     Effort: Pulmonary effort is normal. No respiratory distress.     Breath sounds: Wheezing present. No rales.  Abdominal:     General: There is no distension.     Palpations: Abdomen is soft.     Tenderness: There is no abdominal tenderness. There is no guarding.  Musculoskeletal:        General: No tenderness.     Cervical back: Normal range of motion.  Skin:    General: Skin is warm and dry.     Findings: No erythema or rash.  Neurological:     Mental Status: She is alert and oriented to person, place, and time.     ED Results / Procedures / Treatments   Labs (all labs ordered are listed, but only abnormal results are displayed) Labs Reviewed - No data to display  EKG None  Radiology No results found.  Procedures Procedures (including critical care time)  Medications Ordered in ED Medications  albuterol (VENTOLIN HFA) 108 (90 Base) MCG/ACT inhaler 4 puff (4 puffs Inhalation Given 09/17/20 1041)  predniSONE (DELTASONE) tablet 60 mg (60 mg Oral Given 09/17/20 1135)  fluticasone (FLOVENT HFA) 44 MCG/ACT inhaler 2 puff (2 puffs Inhalation Given 09/17/20 1134)    ED Course  I have reviewed the triage vital signs and the nursing notes.  Pertinent labs & imaging results that were available during my care of the patient were reviewed by me and considered in my medical decision making (see chart for details).    MDM  Rules/Calculators/A&P                          24yo female presents with concern for shortness of breath and wheezing.  History and exam consistent with asthma exacerbation. Doubt PE given no asymmetric leg swelling, no hypoxia, no risk factors. Breath sounds bilaterally, no sign of pneumothorax or pneumonia by exam. Has had multiple recent COVID tests that were negative. Suspect allergy as trigger for this exacerbation.   Given albuterol with improvement and given additional flovent for maintenance.  Given prednisone in ED and rx for asthma exacerbation. Patient discharged in stable condition with understanding of reasons to return.  Final Clinical Impression(s) / ED Diagnoses Final diagnoses:  Moderate persistent asthma with exacerbation    Rx / DC Orders ED Discharge Orders         Ordered    predniSONE (DELTASONE) 10 MG tablet  Daily        09/17/20 1129           Alvira Monday, MD 09/17/20 2214

## 2020-09-17 NOTE — Discharge Instructions (Signed)
Take your new inhaler flovent 2 puffs twice a day every day for maintenance and take albuterol 2 puffs every 4-6 hours for wheezing.

## 2020-09-17 NOTE — ED Triage Notes (Signed)
"  Asthma flare up" for the past 6 days.  Inhalers not working.  Cough and SOB.  Has been tested for Covid in past couple weeks and also had 1st vaccine Sept 10th.

## 2020-11-04 ENCOUNTER — Encounter (HOSPITAL_BASED_OUTPATIENT_CLINIC_OR_DEPARTMENT_OTHER): Payer: Self-pay | Admitting: Emergency Medicine

## 2020-11-04 ENCOUNTER — Other Ambulatory Visit: Payer: Self-pay

## 2020-11-04 ENCOUNTER — Emergency Department (HOSPITAL_BASED_OUTPATIENT_CLINIC_OR_DEPARTMENT_OTHER)
Admission: EM | Admit: 2020-11-04 | Discharge: 2020-11-04 | Disposition: A | Discharge status: Home / Self Care | Payer: Self-pay | Attending: Emergency Medicine | Admitting: Emergency Medicine

## 2020-11-04 ENCOUNTER — Emergency Department (HOSPITAL_BASED_OUTPATIENT_CLINIC_OR_DEPARTMENT_OTHER): Payer: Self-pay

## 2020-11-04 DIAGNOSIS — Z20822 Contact with and (suspected) exposure to covid-19: Secondary | ICD-10-CM | POA: Insufficient documentation

## 2020-11-04 DIAGNOSIS — J069 Acute upper respiratory infection, unspecified: Secondary | ICD-10-CM | POA: Insufficient documentation

## 2020-11-04 DIAGNOSIS — J45909 Unspecified asthma, uncomplicated: Secondary | ICD-10-CM | POA: Insufficient documentation

## 2020-11-04 DIAGNOSIS — R0789 Other chest pain: Secondary | ICD-10-CM | POA: Insufficient documentation

## 2020-11-04 LAB — RESPIRATORY PANEL BY RT PCR (FLU A&B, COVID)
Influenza A by PCR: NEGATIVE
Influenza B by PCR: NEGATIVE
SARS Coronavirus 2 by RT PCR: NEGATIVE

## 2020-11-04 MED ORDER — ALBUTEROL SULFATE HFA 108 (90 BASE) MCG/ACT IN AERS: 1.0000 | INHALATION_SPRAY | Freq: Four times a day (QID) | RESPIRATORY_TRACT | 1 refills | Status: DC | PRN

## 2020-11-04 MED ORDER — AEROCHAMBER PLUS FLO-VU MEDIUM MISC
1.0000 | Freq: Once | Status: AC
  Administered 2020-11-04: 1
  Filled 2020-11-04: qty 1

## 2020-11-04 MED ORDER — BENZONATATE 100 MG PO CAPS
100.0000 mg | ORAL_CAPSULE | Freq: Three times a day (TID) | ORAL | 0 refills | Status: DC
Start: 2020-11-04 — End: 2021-07-10

## 2020-11-04 MED ORDER — ALBUTEROL SULFATE HFA 108 (90 BASE) MCG/ACT IN AERS
4.0000 | INHALATION_SPRAY | Freq: Once | RESPIRATORY_TRACT | Status: AC
  Administered 2020-11-04: 4 via RESPIRATORY_TRACT
  Filled 2020-11-04: qty 6.7

## 2020-11-04 NOTE — ED Provider Notes (Signed)
[facility] EMERGENCY DEPARTMENT Provider Note   CSN: [id number] Arrival date & time: 11/04/20  8250     History Chief Complaint  Patient presents with  . Cough    Leah Lamb is a 24 y.o. female.  HPI Patient is a 24 year old female with a medical history as noted below.  She presents to the emergency department today due to a cough as well as congestion for the past 2 weeks.  She states the cough is productive.  No fevers, chills, sore throat.  She reports some mild chest tightness when coughing.  Endorses a history of asthma and states that she has been out of her albuterol inhaler.  She has been taking Mucinex with mild relief.  Mild wheezing at night.  No shortness of breath, chest pain, nausea, vomiting, fevers, chills, urinary complaints, leg swelling, calf pain.    Past Medical History:  Diagnosis Date  . Anemia   . Asthma   . Obesity     There are no problems to display for this patient.   Past Surgical History:  Procedure Laterality Date  . OOPHORECTOMY Left      OB History   No obstetric history on file.     Family History  Problem Relation Age of Onset  . Asthma Neg Hx   . Allergic rhinitis Neg Hx   . Eczema Neg Hx     Social History   Tobacco Use  . Smoking status: Never Smoker  . Smokeless tobacco: Never Used  Substance Use Topics  . Alcohol use: Never  . Drug use: Never    Home Medications Prior to Admission medications   Medication Sig Start Date End Date Taking? Authorizing Provider  albuterol (PROVENTIL) (2.5 MG/3ML) 0.083% nebulizer solution Take 3 mLs (2.5 mg total) by nebulization every 6 (six) hours as needed for wheezing or shortness of breath. 01/11/20   Marcelyn Bruins, MD  albuterol (PROVENTIL) (2.5 MG/3ML) 0.083% nebulizer solution Take 3 mLs (2.5 mg total) by nebulization every 4 (four) hours as needed. 01/31/20   Marcelyn Bruins, MD  albuterol (VENTOLIN HFA) 108 (90 Base) MCG/ACT inhaler  Inhale 1-2 puffs into the lungs every 6 (six) hours as needed for wheezing or shortness of breath. 08/23/20   Milagros Loll, MD  cetirizine (ZYRTEC) 10 MG tablet Take 1 tablet (10 mg total) by mouth daily for 14 days. 06/12/20 06/26/20  Alvira Monday, MD  hydrOXYzine (ATARAX/VISTARIL) 25 MG tablet Take 1 tablet (25 mg total) by mouth every 6 (six) hours as needed for itching (allergy symptoms). 01/04/20   Long, Arlyss Repress, MD  levocetirizine (XYZAL) 5 MG tablet Take 1 tablet (5 mg total) by mouth every evening. 01/21/20   Arthor Captain, PA-C  methylPREDNISolone (MEDROL DOSEPAK) 4 MG TBPK tablet Use as directed 01/21/20   Arthor Captain, PA-C  mometasone-formoterol (DULERA) 200-5 MCG/ACT AERO Inhale 2 puffs into the lungs 2 (two) times daily. 06/12/20   Marcelyn Bruins, MD  montelukast (SINGULAIR) 10 MG tablet Take 1 tablet (10 mg total) by mouth at bedtime. 01/11/20   Marcelyn Bruins, MD  budesonide-formoterol [facility]) 160-4.5 MCG/ACT inhaler Inhale 2 puffs into the lungs 2 (two) times daily. 01/11/20 09/17/20  Marcelyn Bruins, MD    Allergies    Amoxicillin and Motrin [ibuprofen]  Review of Systems   Review of Systems  Constitutional: Negative for chills and fever.  HENT: Positive for congestion. Negative for rhinorrhea, sore throat and trouble swallowing.   Respiratory: Positive  for cough and wheezing. Negative for shortness of breath.   Cardiovascular: Negative for chest pain and leg swelling.  Gastrointestinal: Negative for nausea and vomiting.  Genitourinary: Negative for dysuria.    Physical Exam Updated Vital Signs BP 128/71 (BP Location: Right Arm)   Pulse 80   Temp 98.3 F (36.8 C) (Oral)   Resp 18   Ht 5\' 2"  (1.575 m)   Wt 96.2 kg   LMP 10/17/2020 (Approximate)   SpO2 97%   BMI 38.78 kg/m   Physical Exam Vitals and nursing note reviewed.  Constitutional:      General: She is not in acute distress.    Appearance: Normal appearance. She  is not ill-appearing, toxic-appearing or diaphoretic.  HENT:     Head: Normocephalic and atraumatic.     Right Ear: External ear normal.     Left Ear: External ear normal.     Nose: Nose normal.     Mouth/Throat:     Mouth: Mucous membranes are moist.     Pharynx: Oropharynx is clear. No oropharyngeal exudate or posterior oropharyngeal erythema.  Eyes:     General: No scleral icterus.       Right eye: No discharge.        Left eye: No discharge.     Extraocular Movements: Extraocular movements intact.     Conjunctiva/sclera: Conjunctivae normal.  Cardiovascular:     Rate and Rhythm: Normal rate and regular rhythm.     Pulses: Normal pulses.     Heart sounds: Normal heart sounds. No murmur heard.  No friction rub. No gallop.   Pulmonary:     Effort: Pulmonary effort is normal. No respiratory distress.     Breath sounds: No stridor. Wheezing present. No rhonchi or rales.     Comments: Mild expiratory wheezes noted bilaterally.  Oxygen saturations in the high 90s on room air.  Patient speaking in clear and complete sentences. Abdominal:     General: Abdomen is flat.  Musculoskeletal:        General: Normal range of motion.     Cervical back: Normal range of motion and neck supple. No tenderness.     Right lower leg: No edema.     Left lower leg: No edema.  Skin:    General: Skin is warm and dry.  Neurological:     General: No focal deficit present.     Mental Status: She is alert and oriented to person, place, and time.  Psychiatric:        Mood and Affect: Mood normal.        Behavior: Behavior normal.    ED Results / Procedures / Treatments   Labs (all labs ordered are listed, but only abnormal results are displayed) Labs Reviewed  RESPIRATORY PANEL BY RT PCR (FLU A&B, COVID)   EKG None  Radiology DG Chest Portable 1 View  Result Date: 11/04/2020 CLINICAL DATA:  Cough, shortness of breath. EXAM: PORTABLE CHEST 1 VIEW COMPARISON:  January 04, 2020. FINDINGS: The  heart size and mediastinal contours are within normal limits. Both lungs are clear. No pneumothorax or pleural effusion is noted. The visualized skeletal structures are unremarkable. IMPRESSION: No active disease. Electronically Signed   By: January 06, 2020 M.D.   On: 11/04/2020 12:36    Procedures Procedures (including critical care time)  Medications Ordered in ED Medications  albuterol (VENTOLIN HFA) 108 (90 Base) MCG/ACT inhaler 4 puff (4 puffs Inhalation Given 11/04/20 1142)  AeroChamber Plus Flo-Vu Medium  MISC 1 each (1 each Other Given 11/04/20 1145)   ED Course  I have reviewed the triage vital signs and the nursing notes.  Pertinent labs & imaging results that were available during my care of the patient were reviewed by me and considered in my medical decision making (see chart for details).  Clinical Course as of Nov 04 1249  Tue Nov 04, 2020  1247 No active disease.  DG Chest Portable 1 View [LJ]    Clinical Course User Index [LJ] Placido Sou, PA-C   MDM Rules/Calculators/A&P                          Patient presents today with what appears to be symptoms consistent with a viral URI.  Respiratory panel is negative.  She has been vaccinated for COVID-19.  Physical exam is extremely reassuring.  She was initially experiencing some mild expiratory wheezes on my exam so she was given albuterol.  On reassessment she is having no continued wheezing.  Given the length of her symptoms I also obtained a chest x-ray which was negative.  Patient has an albuterol inhaler she can now take home.  We will give her an additional prescription for albuterol as well as a prescription for Tessalon Perles.  She does not have health insurance so I will give her follow-up with [facility].  She is planning on following up with them.  Return to the ED with any new or worsening symptoms.  Her questions were answered and she was amicable at the time of discharge.  Her  vital signs are stable.  Final Clinical Impression(s) / ED Diagnoses Final diagnoses:  Viral URI with cough    Rx / DC Orders ED Discharge Orders         Ordered    albuterol (VENTOLIN HFA) 108 (90 Base) MCG/ACT inhaler  Every 6 hours PRN        11/04/20 1252    benzonatate (TESSALON) 100 MG capsule  Every 8 hours        11/04/20 1252           Placido Sou, PA-C 11/04/20 1254    Terrilee Files, MD 11/04/20 1907

## 2020-11-04 NOTE — ED Triage Notes (Signed)
Pt endorse productive cough with chest and nasal congestion x 2 weeks. Pt denies fever, endorses hx of asthma, reports out of inhaler meds

## 2020-11-04 NOTE — Discharge Instructions (Signed)
I prescribed you 2 medications.  The first is a new albuterol inhaler.  The second is Lawyer.  You can take these up to 3 times a day for your cough.  Please continue taking your Mucinex.  Follow the instructions on the box.  I have given you follow-up information for [facility].  Please reach out to them to schedule a follow-up appointment.  Return to the ER with any new or worsening symptoms.  It was a pleasure to meet you.

## 2020-11-08 ENCOUNTER — Emergency Department (HOSPITAL_BASED_OUTPATIENT_CLINIC_OR_DEPARTMENT_OTHER)
Admission: EM | Admit: 2020-11-08 | Discharge: 2020-11-08 | Disposition: A | Discharge status: Home / Self Care | Payer: Self-pay | Attending: Emergency Medicine | Admitting: Emergency Medicine

## 2020-11-08 ENCOUNTER — Other Ambulatory Visit: Payer: Self-pay

## 2020-11-08 DIAGNOSIS — J4521 Mild intermittent asthma with (acute) exacerbation: Secondary | ICD-10-CM | POA: Insufficient documentation

## 2020-11-08 DIAGNOSIS — Z7951 Long term (current) use of inhaled steroids: Secondary | ICD-10-CM | POA: Insufficient documentation

## 2020-11-08 MED ORDER — ALBUTEROL SULFATE (2.5 MG/3ML) 0.083% IN NEBU
2.5000 mg | INHALATION_SOLUTION | Freq: Once | RESPIRATORY_TRACT | Status: AC
  Administered 2020-11-08: 2.5 mg via RESPIRATORY_TRACT
  Filled 2020-11-08: qty 3

## 2020-11-08 MED ORDER — METHYLPREDNISOLONE SODIUM SUCC 125 MG IJ SOLR: 125.0000 mg | Freq: Once | INTRAMUSCULAR | Status: DC

## 2020-11-08 MED ORDER — METHYLPREDNISOLONE 4 MG PO TBPK
ORAL_TABLET | ORAL | 0 refills | Status: DC
Start: 2020-11-08 — End: 2021-07-10

## 2020-11-08 MED ORDER — IPRATROPIUM-ALBUTEROL 0.5-2.5 (3) MG/3ML IN SOLN
3.0000 mL | Freq: Once | RESPIRATORY_TRACT | Status: AC
  Administered 2020-11-08: 3 mL via RESPIRATORY_TRACT
  Filled 2020-11-08: qty 3

## 2020-11-08 MED ORDER — DEXAMETHASONE SODIUM PHOSPHATE 10 MG/ML IJ SOLN
10.0000 mg | Freq: Once | INTRAMUSCULAR | Status: AC
  Administered 2020-11-08: 10 mg via INTRAMUSCULAR
  Filled 2020-11-08: qty 1

## 2020-11-08 MED ORDER — ALBUTEROL SULFATE (2.5 MG/3ML) 0.083% IN NEBU
5.0000 mg | INHALATION_SOLUTION | Freq: Once | RESPIRATORY_TRACT | Status: AC
  Administered 2020-11-08: 5 mg via RESPIRATORY_TRACT
  Filled 2020-11-08: qty 6

## 2020-11-08 MED ORDER — ALBUTEROL SULFATE (2.5 MG/3ML) 0.083% IN NEBU
2.5000 mg | INHALATION_SOLUTION | RESPIRATORY_TRACT | 0 refills | Status: DC | PRN
Start: 2020-11-08 — End: 2020-12-05

## 2020-11-08 NOTE — ED Provider Notes (Signed)
[facility] EMERGENCY DEPARTMENT Provider Note   CSN: [id number] Arrival date & time: 11/08/20  6283     History Chief Complaint  Patient presents with  . Shortness of Breath    Leah Lamb is a 24 y.o. female.  Pt presents to the ED today with sob.  She was here on 11/9 for sob.  She was told she had a URI.  She has been vaccinated against Covid and her Covid test then was negative.  She has a hx of asthma and has been using her inhaler frequently.  Pt last used her inhaler about 30 min ago.  Pt denies any f/c.  She does not smoke and denies being around any smokers.        Past Medical History:  Diagnosis Date  . Anemia   . Asthma   . Obesity     There are no problems to display for this patient.   Past Surgical History:  Procedure Laterality Date  . OOPHORECTOMY Left      OB History   No obstetric history on file.     Family History  Problem Relation Age of Onset  . Asthma Neg Hx   . Allergic rhinitis Neg Hx   . Eczema Neg Hx     Social History   Tobacco Use  . Smoking status: Never Smoker  . Smokeless tobacco: Never Used  Substance Use Topics  . Alcohol use: Never  . Drug use: Never    Home Medications Prior to Admission medications   Medication Sig Start Date End Date Taking? Authorizing Provider  albuterol (VENTOLIN HFA) 108 (90 Base) MCG/ACT inhaler Inhale 1-2 puffs into the lungs every 6 (six) hours as needed for wheezing or shortness of breath. 11/04/20  Yes Placido Sou, PA-C  albuterol (PROVENTIL) (2.5 MG/3ML) 0.083% nebulizer solution Take 3 mLs (2.5 mg total) by nebulization every 4 (four) hours as needed for wheezing or shortness of breath. 11/08/20   Jacalyn Lefevre, MD  benzonatate (TESSALON) 100 MG capsule Take 1 capsule (100 mg total) by mouth every 8 (eight) hours. 11/04/20   Placido Sou, PA-C  cetirizine (ZYRTEC) 10 MG tablet Take 1 tablet (10 mg total) by mouth daily for 14 days. 06/12/20 06/26/20   Alvira Monday, MD  hydrOXYzine (ATARAX/VISTARIL) 25 MG tablet Take 1 tablet (25 mg total) by mouth every 6 (six) hours as needed for itching (allergy symptoms). 01/04/20   Long, Arlyss Repress, MD  levocetirizine (XYZAL) 5 MG tablet Take 1 tablet (5 mg total) by mouth every evening. 01/21/20   Arthor Captain, PA-C  methylPREDNISolone (MEDROL DOSEPAK) 4 MG TBPK tablet Use as directed 11/08/20   Jacalyn Lefevre, MD  mometasone-formoterol [facility]) 200-5 MCG/ACT AERO Inhale 2 puffs into the lungs 2 (two) times daily. 06/12/20   Marcelyn Bruins, MD  montelukast (SINGULAIR) 10 MG tablet Take 1 tablet (10 mg total) by mouth at bedtime. 01/11/20   Marcelyn Bruins, MD  budesonide-formoterol [facility]) 160-4.5 MCG/ACT inhaler Inhale 2 puffs into the lungs 2 (two) times daily. 01/11/20 09/17/20  Marcelyn Bruins, MD    Allergies    Amoxicillin and Motrin [ibuprofen]  Review of Systems   Review of Systems  Respiratory: Positive for cough, shortness of breath and wheezing.   All other systems reviewed and are negative.   Physical Exam Updated Vital Signs BP 132/75 (BP Location: Right Arm)   Pulse 88   Temp 98.2 F (36.8 C) (Oral)   Resp (!) 22  Ht 5\' 2"  (1.575 m)   Wt 96.6 kg   LMP 10/17/2020 (Approximate)   SpO2 100%   BMI 38.96 kg/m   Physical Exam Vitals and nursing note reviewed.  Constitutional:      Appearance: She is well-developed. She is obese.  HENT:     Head: Normocephalic and atraumatic.     Mouth/Throat:     Mouth: Mucous membranes are moist.     Pharynx: Oropharynx is clear.  Eyes:     Extraocular Movements: Extraocular movements intact.     Pupils: Pupils are equal, round, and reactive to light.  Cardiovascular:     Rate and Rhythm: Normal rate and regular rhythm.  Pulmonary:     Breath sounds: Decreased breath sounds and wheezing present.  Abdominal:     General: Bowel sounds are normal.     Palpations: Abdomen is soft.  Musculoskeletal:          General: Normal range of motion.     Cervical back: Normal range of motion and neck supple.  Skin:    General: Skin is warm.     Capillary Refill: Capillary refill takes less than 2 seconds.  Neurological:     General: No focal deficit present.     Mental Status: She is alert and oriented to person, place, and time.  Psychiatric:        Mood and Affect: Mood normal.        Behavior: Behavior normal.     ED Results / Procedures / Treatments   Labs (all labs ordered are listed, but only abnormal results are displayed) Labs Reviewed - No data to display  EKG None  Radiology No results found.  Procedures Procedures (including critical care time)  Medications Ordered in ED Medications  ipratropium-albuterol (DUONEB) 0.5-2.5 (3) MG/3ML nebulizer solution 3 mL (3 mLs Nebulization Given 11/08/20 0742)  albuterol (PROVENTIL) (2.5 MG/3ML) 0.083% nebulizer solution 2.5 mg (2.5 mg Nebulization Given 11/08/20 0742)  dexamethasone (DECADRON) injection 10 mg (10 mg Intramuscular Given 11/08/20 0749)  albuterol (PROVENTIL) (2.5 MG/3ML) 0.083% nebulizer solution 5 mg (5 mg Nebulization Given 11/08/20 11/10/20)    ED Course  I have reviewed the triage vital signs and the nursing notes.  Pertinent labs & imaging results that were available during my care of the patient were reviewed by me and considered in my medical decision making (see chart for details).    MDM Rules/Calculators/A&P                          Pt is feeling better after nebs and decadron.  RT walked pt around the hall and O2 sat stayed b/t 98 and 100%.  She feels well enough to go home.  Pt is out of her albuterol for her neb machine.  Pt given a refill of that and a rx for a medrol dose pack.  She does not have a pcp and is given the number to community health and wellness. She is to return if worse.  Final Clinical Impression(s) / ED Diagnoses Final diagnoses:  Mild intermittent asthma with exacerbation    Rx / DC  Orders ED Discharge Orders         Ordered    methylPREDNISolone (MEDROL DOSEPAK) 4 MG TBPK tablet        11/08/20 0838    albuterol (PROVENTIL) (2.5 MG/3ML) 0.083% nebulizer solution  Every 4 hours PRN        11/08/20 11/10/20  Jacalyn Lefevre, MD 11/08/20 [phone]

## 2020-11-08 NOTE — ED Triage Notes (Signed)
States," I was here 4 days ago and they told me I had a URI, virus but I am still SOB" Last used a;buterol inhaler x 30 min ago

## 2020-11-20 ENCOUNTER — Other Ambulatory Visit: Payer: Self-pay

## 2020-11-20 ENCOUNTER — Emergency Department (HOSPITAL_BASED_OUTPATIENT_CLINIC_OR_DEPARTMENT_OTHER)
Admission: EM | Admit: 2020-11-20 | Discharge: 2020-11-20 | Disposition: A | Discharge status: Home / Self Care | Payer: Self-pay | Attending: Emergency Medicine | Admitting: Emergency Medicine

## 2020-11-20 ENCOUNTER — Encounter (HOSPITAL_BASED_OUTPATIENT_CLINIC_OR_DEPARTMENT_OTHER): Payer: Self-pay

## 2020-11-20 DIAGNOSIS — J4531 Mild persistent asthma with (acute) exacerbation: Secondary | ICD-10-CM | POA: Insufficient documentation

## 2020-11-20 DIAGNOSIS — Z7951 Long term (current) use of inhaled steroids: Secondary | ICD-10-CM | POA: Insufficient documentation

## 2020-11-20 MED ORDER — PREDNISONE 10 MG PO TABS
60.0000 mg | ORAL_TABLET | Freq: Every day | ORAL | 0 refills | Status: DC
Start: 2020-11-20 — End: 2020-12-05

## 2020-11-20 MED ORDER — PREDNISONE 50 MG PO TABS
60.0000 mg | ORAL_TABLET | Freq: Once | ORAL | Status: AC
  Administered 2020-11-20: 60 mg via ORAL
  Filled 2020-11-20: qty 1

## 2020-11-20 MED ORDER — FLUTICASONE PROPIONATE HFA 44 MCG/ACT IN AERO
2.0000 | INHALATION_SPRAY | Freq: Once | RESPIRATORY_TRACT | Status: AC
  Administered 2020-11-20: 2 via RESPIRATORY_TRACT
  Filled 2020-11-20: qty 10.6

## 2020-11-20 MED ORDER — ALBUTEROL SULFATE HFA 108 (90 BASE) MCG/ACT IN AERS
INHALATION_SPRAY | RESPIRATORY_TRACT | Status: AC
  Filled 2020-11-20: qty 6.7

## 2020-11-20 MED ORDER — ALBUTEROL SULFATE HFA 108 (90 BASE) MCG/ACT IN AERS
8.0000 | INHALATION_SPRAY | Freq: Once | RESPIRATORY_TRACT | Status: AC
  Administered 2020-11-20: 8 via RESPIRATORY_TRACT

## 2020-11-20 NOTE — ED Triage Notes (Signed)
Pt c/o SOB "asthma" x 3 days-last used inhaler yesterday-denies fever/flu sx-NAD-steady gait

## 2020-11-20 NOTE — ED Notes (Signed)
RT

## 2020-11-20 NOTE — ED Provider Notes (Signed)
[facility] EMERGENCY DEPARTMENT Provider Note   CSN: [id number] Arrival date & time: 11/20/20  1832     History Chief Complaint  Patient presents with  . Shortness of Breath    Leah Lamb is a 24 y.o. female.  HPI Patient is a 25 year old female with past medical history of asthma and obesity presenting today with 2 days / 2 days of shortness of breath and tightness of her chest.  She states that this is a frequent occurrence and has happened several times about 3 months.  She states that she works for [employer] when she is walking outdoors with food she feels that it triggers her asthma and that she will become short of breath and have wheezing for days.  She states that she is not on any steroids currently.  She denies any fevers, chills, lightheadedness chest pain there hemoptysis.  She states that she has had a very mild cough in the past few days but is productive of some clear phlegm.  She states that she feels well currently she has already received 8 puffs of albuterol from respiratory therapist prior to my arrival in the room.    Past Medical History:  Diagnosis Date  . Anemia   . Asthma   . Obesity     There are no problems to display for this patient.   Past Surgical History:  Procedure Laterality Date  . OOPHORECTOMY Left      OB History   No obstetric history on file.     Family History  Problem Relation Age of Onset  . Asthma Neg Hx   . Allergic rhinitis Neg Hx   . Eczema Neg Hx     Social History   Tobacco Use  . Smoking status: Never Smoker  . Smokeless tobacco: Never Used  Vaping Use  . Vaping Use: Never used  Substance Use Topics  . Alcohol use: Never  . Drug use: Never    Home Medications Prior to Admission medications   Medication Sig Start Date End Date Taking? Authorizing Provider  albuterol (PROVENTIL) (2.5 MG/3ML) 0.083% nebulizer solution Take 3 mLs (2.5 mg total) by nebulization every 4 (four) hours as  needed for wheezing or shortness of breath. 11/08/20   Jacalyn Lefevre, MD  albuterol (VENTOLIN HFA) 108 (90 Base) MCG/ACT inhaler Inhale 1-2 puffs into the lungs every 6 (six) hours as needed for wheezing or shortness of breath. 11/04/20   Placido Sou, PA-C  benzonatate (TESSALON) 100 MG capsule Take 1 capsule (100 mg total) by mouth every 8 (eight) hours. 11/04/20   Placido Sou, PA-C  cetirizine (ZYRTEC) 10 MG tablet Take 1 tablet (10 mg total) by mouth daily for 14 days. 06/12/20 06/26/20  Alvira Monday, MD  hydrOXYzine (ATARAX/VISTARIL) 25 MG tablet Take 1 tablet (25 mg total) by mouth every 6 (six) hours as needed for itching (allergy symptoms). 01/04/20   Long, Arlyss Repress, MD  levocetirizine (XYZAL) 5 MG tablet Take 1 tablet (5 mg total) by mouth every evening. 01/21/20   Arthor Captain, PA-C  methylPREDNISolone (MEDROL DOSEPAK) 4 MG TBPK tablet Use as directed 11/08/20   Jacalyn Lefevre, MD  mometasone-formoterol [facility]) 200-5 MCG/ACT AERO Inhale 2 puffs into the lungs 2 (two) times daily. 06/12/20   Marcelyn Bruins, MD  montelukast (SINGULAIR) 10 MG tablet Take 1 tablet (10 mg total) by mouth at bedtime. 01/11/20   Marcelyn Bruins, MD  predniSONE (DELTASONE) 10 MG tablet Take 6 tablets (60 mg total) by  mouth daily. 11/20/20   Gailen Shelter, PA  budesonide-formoterol (SYMBICORT) 160-4.5 MCG/ACT inhaler Inhale 2 puffs into the lungs 2 (two) times daily. 01/11/20 09/17/20  Marcelyn Bruins, MD    Allergies    Amoxicillin and Motrin [ibuprofen]  Review of Systems   Review of Systems  Constitutional: Positive for fatigue. Negative for chills and fever.  HENT: Negative for congestion.   Eyes: Negative for pain.  Respiratory: Positive for chest tightness and shortness of breath. Negative for cough.   Cardiovascular: Negative for chest pain and leg swelling.  Gastrointestinal: Negative for abdominal distention, abdominal pain and vomiting.  Genitourinary:  Negative for dysuria.  Musculoskeletal: Negative for myalgias.  Skin: Negative for rash.  Neurological: Negative for dizziness and headaches.    Physical Exam Updated Vital Signs BP 111/73   Pulse 76   Temp 97.9 F (36.6 C) (Oral)   Resp 18   Ht 5\' 2"  (1.575 m)   Wt 97.5 kg   LMP 11/17/2020   SpO2 100%   BMI 39.32 kg/m   Physical Exam Vitals and nursing note reviewed.  Constitutional:      General: She is not in acute distress.    Comments: Pleasant well-appearing 24 year old.  In no acute distress.  Sitting comfortably in bed.  Able answer questions appropriately follow commands. No increased work of breathing. Speaking in full sentences.  HENT:     Head: Normocephalic and atraumatic.     Nose: Nose normal.  Eyes:     General: No scleral icterus. Cardiovascular:     Rate and Rhythm: Normal rate and regular rhythm.     Pulses: Normal pulses.     Heart sounds: Normal heart sounds.  Pulmonary:     Effort: Pulmonary effort is normal. No respiratory distress.     Breath sounds: No wheezing.     Comments: Lungs are clear to auscultation all fields.  No increased work of breathing.  Speaking full sentences.  SPO2 100 was on room air.  Not tachypneic. Abdominal:     Palpations: Abdomen is soft.     Tenderness: There is no abdominal tenderness.  Musculoskeletal:     Cervical back: Normal range of motion.     Right lower leg: No edema.     Left lower leg: No edema.     Comments: No lower extremity edema.  No calf tenderness.  Skin:    General: Skin is warm and dry.     Capillary Refill: Capillary refill takes less than 2 seconds.  Neurological:     Mental Status: She is alert. Mental status is at baseline.  Psychiatric:        Mood and Affect: Mood normal.        Behavior: Behavior normal.     ED Results / Procedures / Treatments   Labs (all labs ordered are listed, but only abnormal results are displayed) Labs Reviewed - No data to  display  EKG None  Radiology No results found.  Procedures Procedures (including critical care time)  Medications Ordered in ED Medications  albuterol (VENTOLIN HFA) 108 (90 Base) MCG/ACT inhaler 8 puff (8 puffs Inhalation Given 11/20/20 1846)  predniSONE (DELTASONE) tablet 60 mg (60 mg Oral Given 11/20/20 1919)  fluticasone (FLOVENT HFA) 44 MCG/ACT inhaler 2 puff (2 puffs Inhalation Given 11/20/20 1928)    ED Course  I have reviewed the triage vital signs and the nursing notes.  Pertinent labs & imaging results that were available during my care of the  patient were reviewed by me and considered in my medical decision making (see chart for details).    MDM Rules/Calculators/A&P                          Patient is 24 year old asthmatic.  It appears that she is here for an asthma exacerbation she was seen by RT prior to me evaluating her and has already had a positive albuterol is no longer wheezing.  She is reassessed by RT and has had significant improvement.  She is not tachypneic tachycardic or hypoxic she is satting 100% on room air.  Her lungs are clear to auscultation all fields.  She is vaccinated for COVID-19.  Patient has had-on my review of the EMR-has had 6 asthma exacerbations over the past 6 months.  She has been on frequent prescriptions of systemic corticosteroids.  Today on my evaluation she is very well-appearing. Given her frequent asthma exacerbations take steps to improve patient's control regimen.  Respiratory therapy was able to counsel patient on MDI technique and this was improved upon.  Patient was given a Flovent inhaler.  She will use this 2 puffs in the morning 2 puffs in the evening.  She was also given prednisone burst.  Given return precautions.  As patient has significant improvement in her symptoms as vital signs within normal limits will discharge with close follow-up with the [facility].  Final Clinical Impression(s) / ED  Diagnoses Final diagnoses:  Mild persistent asthma with exacerbation    Rx / DC Orders ED Discharge Orders         Ordered    predniSONE (DELTASONE) 10 MG tablet  Daily        11/20/20 1922           Solon [place], [place] 11/20/20 1941    Little, Ambrose [place], MD 11/20/20 2324

## 2020-11-20 NOTE — Discharge Instructions (Addendum)
Please take the prednisone I prescribed you for the next 5 days.  Please drink plenty of water please monitor your symptoms use your rescue inhaler/albuterol inhaler as needed.  I have also given you a steroid inhaler called Flovent please use 2 puffs twice a day (two in the morning two in the evening).  Go ahead and start the flovent today and continue to use it as prescribed. Follow up with the [place] and wellness clinic. They will follow up on your prescriptions. PLEASE INFORM THEM IF YOU NEED FINANCIAL COUNSELING/ASSISTANCE.

## 2020-12-05 ENCOUNTER — Emergency Department (HOSPITAL_BASED_OUTPATIENT_CLINIC_OR_DEPARTMENT_OTHER)
Admission: EM | Admit: 2020-12-05 | Discharge: 2020-12-06 | Disposition: A | Discharge status: Home / Self Care | Payer: Self-pay | Attending: Emergency Medicine | Admitting: Emergency Medicine

## 2020-12-05 ENCOUNTER — Other Ambulatory Visit: Payer: Self-pay

## 2020-12-05 ENCOUNTER — Encounter (HOSPITAL_BASED_OUTPATIENT_CLINIC_OR_DEPARTMENT_OTHER): Payer: Self-pay | Admitting: *Deleted

## 2020-12-05 DIAGNOSIS — Z79899 Other long term (current) drug therapy: Secondary | ICD-10-CM | POA: Insufficient documentation

## 2020-12-05 DIAGNOSIS — J4520 Mild intermittent asthma, uncomplicated: Secondary | ICD-10-CM

## 2020-12-05 MED ORDER — ALBUTEROL SULFATE (2.5 MG/3ML) 0.083% IN NEBU
INHALATION_SOLUTION | RESPIRATORY_TRACT | Status: AC
  Administered 2020-12-05: 2.5 mg
  Filled 2020-12-05: qty 3

## 2020-12-05 MED ORDER — PREDNISONE 20 MG PO TABS
60.0000 mg | ORAL_TABLET | Freq: Every day | ORAL | 0 refills | Status: AC
Start: 2020-12-05 — End: 2020-12-09

## 2020-12-05 MED ORDER — ALBUTEROL SULFATE (2.5 MG/3ML) 0.083% IN NEBU
5.0000 mg | INHALATION_SOLUTION | Freq: Once | RESPIRATORY_TRACT | Status: AC
  Administered 2020-12-05: 5 mg via RESPIRATORY_TRACT
  Filled 2020-12-05: qty 6

## 2020-12-05 MED ORDER — ALBUTEROL SULFATE HFA 108 (90 BASE) MCG/ACT IN AERS
2.0000 | INHALATION_SPRAY | RESPIRATORY_TRACT | Status: DC | PRN
  Administered 2020-12-06: 2 via RESPIRATORY_TRACT
  Filled 2020-12-05: qty 6.7

## 2020-12-05 MED ORDER — IPRATROPIUM-ALBUTEROL 0.5-2.5 (3) MG/3ML IN SOLN
RESPIRATORY_TRACT | Status: AC
  Administered 2020-12-05: 3 mL
  Filled 2020-12-05: qty 3

## 2020-12-05 MED ORDER — ALBUTEROL SULFATE (2.5 MG/3ML) 0.083% IN NEBU
2.5000 mg | INHALATION_SOLUTION | RESPIRATORY_TRACT | 0 refills | Status: DC | PRN
Start: 2020-12-05 — End: 2021-04-27

## 2020-12-05 NOTE — ED Provider Notes (Signed)
[facility] EMERGENCY DEPARTMENT Provider Note   CSN: [id number] Arrival date & time: 12/05/20  2253     History Chief Complaint  Patient presents with  . Shortness of Breath    Leah Lamb is a 24 y.o. female.  The history is provided by the patient.  Shortness of Breath Severity:  Mild Onset quality:  Gradual Timing:  Intermittent Progression:  Waxing and waning Chronicity:  Recurrent Context: not URI   Relieved by:  Inhaler Worsened by:  Nothing Ineffective treatments:  Inhaler Associated symptoms: cough and wheezing   Associated symptoms: no abdominal pain, no chest pain, no ear pain, no fever, no rash, no sore throat, no sputum production and no vomiting   Risk factors: no hx of PE/DVT and no recent surgery   Risk factors comment:  Asthma, ran out of inhaler      Past Medical History:  Diagnosis Date  . Anemia   . Asthma   . Obesity     There are no problems to display for this patient.   Past Surgical History:  Procedure Laterality Date  . OOPHORECTOMY Left      OB History   No obstetric history on file.     Family History  Problem Relation Age of Onset  . Asthma Neg Hx   . Allergic rhinitis Neg Hx   . Eczema Neg Hx     Social History   Tobacco Use  . Smoking status: Never Smoker  . Smokeless tobacco: Never Used  Vaping Use  . Vaping Use: Never used  Substance Use Topics  . Alcohol use: Never  . Drug use: Never    Home Medications Prior to Admission medications   Medication Sig Start Date End Date Taking? Authorizing Provider  albuterol (PROVENTIL) (2.5 MG/3ML) 0.083% nebulizer solution Take 3 mLs (2.5 mg total) by nebulization every 4 (four) hours as needed for wheezing or shortness of breath. 12/05/20   Yoon Barca, DO  albuterol (VENTOLIN HFA) 108 (90 Base) MCG/ACT inhaler Inhale 1-2 puffs into the lungs every 6 (six) hours as needed for wheezing or shortness of breath. 11/04/20   Placido Sou, PA-C   benzonatate (TESSALON) 100 MG capsule Take 1 capsule (100 mg total) by mouth every 8 (eight) hours. 11/04/20   Placido Sou, PA-C  cetirizine (ZYRTEC) 10 MG tablet Take 1 tablet (10 mg total) by mouth daily for 14 days. 06/12/20 06/26/20  Alvira Monday, MD  hydrOXYzine (ATARAX/VISTARIL) 25 MG tablet Take 1 tablet (25 mg total) by mouth every 6 (six) hours as needed for itching (allergy symptoms). 01/04/20   Long, Arlyss Repress, MD  levocetirizine (XYZAL) 5 MG tablet Take 1 tablet (5 mg total) by mouth every evening. 01/21/20   Arthor Captain, PA-C  methylPREDNISolone (MEDROL DOSEPAK) 4 MG TBPK tablet Use as directed 11/08/20   Jacalyn Lefevre, MD  mometasone-formoterol [facility]) 200-5 MCG/ACT AERO Inhale 2 puffs into the lungs 2 (two) times daily. 06/12/20   Marcelyn Bruins, MD  montelukast (SINGULAIR) 10 MG tablet Take 1 tablet (10 mg total) by mouth at bedtime. 01/11/20   Marcelyn Bruins, MD  predniSONE (DELTASONE) 20 MG tablet Take 3 tablets (60 mg total) by mouth daily for 4 days. 12/05/20 12/09/20  Willi Borowiak, DO  budesonide-formoterol (SYMBICORT) 160-4.5 MCG/ACT inhaler Inhale 2 puffs into the lungs 2 (two) times daily. 01/11/20 09/17/20  Marcelyn Bruins, MD    Allergies    Amoxicillin and Motrin [ibuprofen]  Review of Systems   Review  of Systems  Constitutional: Negative for chills and fever.  HENT: Negative for ear pain and sore throat.   Eyes: Negative for pain and visual disturbance.  Respiratory: Positive for cough, shortness of breath and wheezing. Negative for sputum production.   Cardiovascular: Negative for chest pain and palpitations.  Gastrointestinal: Negative for abdominal pain and vomiting.  Genitourinary: Negative for dysuria and hematuria.  Musculoskeletal: Negative for arthralgias and back pain.  Skin: Negative for color change and rash.  Neurological: Negative for seizures and syncope.  All other systems reviewed and are  negative.   Physical Exam Updated Vital Signs BP 106/61 (BP Location: Left Arm)   Pulse 79   Temp 98.2 F (36.8 C) (Oral)   Resp 16   Ht 5\' 2"  (1.575 m)   Wt 97.5 kg   LMP 11/17/2020   SpO2 97%   BMI 39.32 kg/m   Physical Exam Vitals and nursing note reviewed.  Constitutional:      General: She is not in acute distress.    Appearance: She is well-developed and well-nourished. She is not ill-appearing.  HENT:     Head: Normocephalic and atraumatic.  Eyes:     Conjunctiva/sclera: Conjunctivae normal.  Cardiovascular:     Rate and Rhythm: Normal rate and regular rhythm.     Pulses: Normal pulses.     Heart sounds: Normal heart sounds. No murmur heard.   Pulmonary:     Effort: Pulmonary effort is normal. No respiratory distress.     Breath sounds: Wheezing present.  Abdominal:     Palpations: Abdomen is soft.     Tenderness: There is no abdominal tenderness.  Musculoskeletal:        General: No edema.     Cervical back: Neck supple.     Right lower leg: No edema.     Left lower leg: No edema.  Skin:    General: Skin is warm and dry.     Capillary Refill: Capillary refill takes less than 2 seconds.  Neurological:     General: No focal deficit present.     Mental Status: She is alert.  Psychiatric:        Mood and Affect: Mood and affect normal.     ED Results / Procedures / Treatments   Labs (all labs ordered are listed, but only abnormal results are displayed) Labs Reviewed  PREGNANCY, URINE    EKG None  Radiology No results found.  Procedures Procedures (including critical care time)  Medications Ordered in ED Medications  albuterol (VENTOLIN HFA) 108 (90 Base) MCG/ACT inhaler 2 puff (2 puffs Inhalation Given 12/06/20 0008)  albuterol (PROVENTIL) (2.5 MG/3ML) 0.083% nebulizer solution (2.5 mg  Given 12/05/20 2315)  ipratropium-albuterol (DUONEB) 0.5-2.5 (3) MG/3ML nebulizer solution (3 mLs  Given 12/05/20 2315)  albuterol (PROVENTIL) (2.5  MG/3ML) 0.083% nebulizer solution 5 mg (5 mg Nebulization Given 12/05/20 2319)  albuterol (PROVENTIL) (2.5 MG/3ML) 0.083% nebulizer solution 5 mg (5 mg Nebulization Given 12/05/20 2330)    ED Course  I have reviewed the triage vital signs and the nursing notes.  Pertinent labs & imaging results that were available during my care of the patient were reviewed by me and considered in my medical decision making (see chart for details).    MDM Rules/Calculators/A&P                          Massa Pe is a 24 year old female with history of asthma who presents  to the ED with asthma exacerbation.  Normal vitals.  No fever.  Symptoms for the last several days.  Ran out of inhaler.  Feels similar to prior asthma attacks.  Overall appears comfortable.  Has some scattered wheezing on exam.  Duo nebs were given with improvement.  No fever, no sputum production.  Doubt infectious process.  Likely secondary to weather changes.  Will prescribe prednisone.  Given inhaler and nebulizer solution prescription.  This chart was dictated using voice recognition software.  Despite best efforts to proofread,  errors can occur which can change the documentation meaning.    Final Clinical Impression(s) / ED Diagnoses Final diagnoses:  Mild intermittent asthma without complication    Rx / DC Orders ED Discharge Orders         Ordered    albuterol (PROVENTIL) (2.5 MG/3ML) 0.083% nebulizer solution  Every 4 hours PRN        12/05/20 2315    predniSONE (DELTASONE) 20 MG tablet  Daily        12/05/20 2315           Virgina Norfolk, DO 12/06/20 0014

## 2020-12-05 NOTE — ED Triage Notes (Signed)
Pt. Reports she has used her inhaler and is out of the inhaler and is not using her Nebs because she does not have the meds.  Pt. Reports she has been having asthma attacks the last 4- 5 days.

## 2020-12-06 LAB — PREGNANCY, URINE: Preg Test, Ur: NEGATIVE

## 2020-12-06 MED ORDER — PREDNISONE 50 MG PO TABS
60.0000 mg | ORAL_TABLET | Freq: Once | ORAL | Status: AC
  Administered 2020-12-06: 60 mg via ORAL
  Filled 2020-12-06: qty 1

## 2021-04-10 ENCOUNTER — Other Ambulatory Visit: Payer: Self-pay | Admitting: Allergy

## 2021-04-27 ENCOUNTER — Encounter (HOSPITAL_BASED_OUTPATIENT_CLINIC_OR_DEPARTMENT_OTHER): Payer: Self-pay

## 2021-04-27 ENCOUNTER — Other Ambulatory Visit: Payer: Self-pay

## 2021-04-27 ENCOUNTER — Other Ambulatory Visit (HOSPITAL_BASED_OUTPATIENT_CLINIC_OR_DEPARTMENT_OTHER): Payer: Self-pay

## 2021-04-27 ENCOUNTER — Emergency Department (HOSPITAL_BASED_OUTPATIENT_CLINIC_OR_DEPARTMENT_OTHER)
Admission: EM | Admit: 2021-04-27 | Discharge: 2021-04-27 | Disposition: A | Discharge status: Home / Self Care | Payer: Self-pay | Attending: Emergency Medicine | Admitting: Emergency Medicine

## 2021-04-27 DIAGNOSIS — J4521 Mild intermittent asthma with (acute) exacerbation: Secondary | ICD-10-CM | POA: Insufficient documentation

## 2021-04-27 MED ORDER — ALBUTEROL SULFATE HFA 108 (90 BASE) MCG/ACT IN AERS
INHALATION_SPRAY | RESPIRATORY_TRACT | Status: AC
  Administered 2021-04-27: 2 via RESPIRATORY_TRACT
  Filled 2021-04-27: qty 6.7

## 2021-04-27 MED ORDER — ALBUTEROL SULFATE HFA 108 (90 BASE) MCG/ACT IN AERS
1.0000 | INHALATION_SPRAY | Freq: Four times a day (QID) | RESPIRATORY_TRACT | 0 refills | Status: DC | PRN
  Filled 2021-04-27: qty 18, 25d supply, fill #0

## 2021-04-27 MED ORDER — ALBUTEROL SULFATE HFA 108 (90 BASE) MCG/ACT IN AERS: 1.0000 | INHALATION_SPRAY | Freq: Four times a day (QID) | RESPIRATORY_TRACT | 0 refills | Status: DC | PRN

## 2021-04-27 MED ORDER — PREDNISONE 10 MG PO TABS: ORAL_TABLET | Freq: Every day | ORAL | 0 refills | Status: DC

## 2021-04-27 MED ORDER — MONTELUKAST SODIUM 5 MG PO CHEW
10.0000 mg | CHEWABLE_TABLET | Freq: Every day | ORAL | 0 refills | Status: DC
  Filled 2021-04-27: qty 60, 30d supply, fill #0

## 2021-04-27 MED ORDER — ALBUTEROL SULFATE (5 MG/ML) 0.5% IN NEBU
2.5000 mg | INHALATION_SOLUTION | Freq: Two times a day (BID) | RESPIRATORY_TRACT | 12 refills | Status: DC | PRN
  Filled 2021-04-27: qty 20, 20d supply, fill #0

## 2021-04-27 MED ORDER — LEVOCETIRIZINE DIHYDROCHLORIDE 5 MG PO TABS
5.0000 mg | ORAL_TABLET | Freq: Every evening | ORAL | 0 refills | Status: DC
  Filled 2021-04-27: qty 30, 30d supply, fill #0

## 2021-04-27 MED ORDER — ALBUTEROL SULFATE HFA 108 (90 BASE) MCG/ACT IN AERS: 2.0000 | INHALATION_SPRAY | Freq: Once | RESPIRATORY_TRACT | Status: AC

## 2021-04-27 MED ORDER — MONTELUKAST SODIUM 5 MG PO CHEW: 10.0000 mg | CHEWABLE_TABLET | Freq: Every day | ORAL | 0 refills | Status: DC

## 2021-04-27 MED ORDER — LEVOCETIRIZINE DIHYDROCHLORIDE 5 MG PO TABS: 5.0000 mg | ORAL_TABLET | Freq: Every evening | ORAL | 0 refills | Status: DC

## 2021-04-27 MED ORDER — PREDNISONE 10 MG PO TABS
ORAL_TABLET | Freq: Every day | ORAL | 0 refills | Status: DC
  Filled 2021-04-27: qty 42, 12d supply, fill #0

## 2021-04-27 MED ORDER — ALBUTEROL SULFATE (5 MG/ML) 0.5% IN NEBU: 2.5000 mg | INHALATION_SOLUTION | Freq: Two times a day (BID) | RESPIRATORY_TRACT | 12 refills | Status: DC | PRN

## 2021-04-27 NOTE — ED Provider Notes (Signed)
[facility] EMERGENCY DEPARTMENT Provider Note   CSN: [id number] Arrival date & time: 04/27/21  1034     History Chief Complaint  Patient presents with  . Shortness of Breath    Leah Lamb is a 25 y.o. female with a history of asthma presents to the ED for evaluation of dry cough, wheezing, chest tightness and shortness of breath.  She ran out of her montelukast and levocetirizine for allergies 1 week ago and symptoms started soon after that.  Symptoms are worse when she is walking, when she is going outside.  This feels like her usual asthma flare symptoms.  Denies any fevers, chills, productive cough, chest pain, hemoptysis, leg swelling.  She is not currently using any inhalers.  Has a nebulizer machine but has not used it.  Vaccinated for COVID twice but no booster.  No tobacco use.  No sick contacts.  No COVID symptoms.  Does not have insurance and has not seen an allergy or asthma specialist.  No interventions.  No alleviating factors.  HPI     Past Medical History:  Diagnosis Date  . Anemia   . Asthma   . Obesity     There are no problems to display for this patient.   Past Surgical History:  Procedure Laterality Date  . OOPHORECTOMY Left      OB History   No obstetric history on file.     Family History  Problem Relation Age of Onset  . Asthma Neg Hx   . Allergic rhinitis Neg Hx   . Eczema Neg Hx     Social History   Tobacco Use  . Smoking status: Never Smoker  . Smokeless tobacco: Never Used  Vaping Use  . Vaping Use: Never used  Substance Use Topics  . Alcohol use: Never  . Drug use: Never    Home Medications Prior to Admission medications   Medication Sig Start Date End Date Taking? Authorizing Provider  albuterol (PROVENTIL) (5 MG/ML) 0.5% nebulizer solution Take 0.5 mLs (2.5 mg total) by nebulization every 12 (twelve) hours as needed for wheezing or shortness of breath. 04/27/21  Yes Liberty Handy, PA-C  albuterol  (VENTOLIN HFA) 108 (90 Base) MCG/ACT inhaler Inhale 1-2 puffs into the lungs every 6 (six) hours as needed for wheezing or shortness of breath. 04/27/21  Yes Liberty Handy, PA-C  levocetirizine (XYZAL) 5 MG tablet Take 1 tablet (5 mg total) by mouth every evening. 04/27/21 05/27/21 Yes Sharen Heck J, PA-C  montelukast (SINGULAIR) 5 MG chewable tablet Chew 2 tablets (10 mg total) by mouth at bedtime. 04/27/21 05/27/21 Yes Liberty Handy, PA-C  predniSONE (STERAPRED UNI-PAK 21 TAB) 10 MG (21) TBPK tablet Take by mouth daily. Take 6 tabs by mouth daily  for 2 days, then 5 tabs for 2 days, then 4 tabs for 2 days, then 3 tabs for 2 days, 2 tabs for 2 days, then 1 tab by mouth daily for 2 days 04/27/21  Yes Margarette Asal, Lana Flaim J, PA-C  benzonatate (TESSALON) 100 MG capsule Take 1 capsule (100 mg total) by mouth every 8 (eight) hours. 11/04/20   Placido Sou, PA-C  cetirizine (ZYRTEC) 10 MG tablet Take 1 tablet (10 mg total) by mouth daily for 14 days. 06/12/20 06/26/20  Alvira Monday, MD  hydrOXYzine (ATARAX/VISTARIL) 25 MG tablet Take 1 tablet (25 mg total) by mouth every 6 (six) hours as needed for itching (allergy symptoms). 01/04/20   Long, Arlyss Repress, MD  methylPREDNISolone (MEDROL  DOSEPAK) 4 MG TBPK tablet Use as directed 11/08/20   Jacalyn Lefevre, MD  mometasone-formoterol [facility]) 200-5 MCG/ACT AERO Inhale 2 puffs into the lungs 2 (two) times daily. 06/12/20   Marcelyn Bruins, MD  budesonide-formoterol [facility]) 160-4.5 MCG/ACT inhaler Inhale 2 puffs into the lungs 2 (two) times daily. 01/11/20 09/17/20  Marcelyn Bruins, MD    Allergies    Amoxicillin and Motrin [ibuprofen]  Review of Systems   Review of Systems  Respiratory: Positive for cough, chest tightness, shortness of breath and wheezing.   All other systems reviewed and are negative.   Physical Exam Updated Vital Signs BP 111/63   Pulse 69   Temp 98.3 F (36.8 C) (Oral)   Resp 18   Ht 5\' 2"  (1.575 m)   Wt 93.9  kg   LMP 03/29/2021 (Exact Date)   SpO2 100%   BMI 37.86 kg/m   Physical Exam Vitals and nursing note reviewed.  Constitutional:      General: She is not in acute distress.    Appearance: She is well-developed.     Comments: NAD.  HENT:     Head: Normocephalic and atraumatic.     Right Ear: External ear normal.     Left Ear: External ear normal.     Nose: Nose normal.  Eyes:     General: No scleral icterus.    Conjunctiva/sclera: Conjunctivae normal.  Cardiovascular:     Rate and Rhythm: Normal rate and regular rhythm.     Heart sounds: Normal heart sounds. No murmur heard.     Comments: No lower extremity edema, calf tenderness. Pulmonary:     Effort: Pulmonary effort is normal.     Breath sounds: Examination of the right-upper field reveals wheezing. Examination of the left-upper field reveals wheezing. Wheezing present.     Comments: Breathing even and unlabored.  Speaking in full sentences.  Faint end expiratory wheezing mostly in upper fields, more prominent on the right.  Diminished air sounds lower lobes bilaterally.  No crackles. Musculoskeletal:        General: No deformity. Normal range of motion.     Cervical back: Normal range of motion and neck supple.  Skin:    General: Skin is warm and dry.     Capillary Refill: Capillary refill takes less than 2 seconds.  Neurological:     Mental Status: She is alert and oriented to person, place, and time.  Psychiatric:        Behavior: Behavior normal.        Thought Content: Thought content normal.        Judgment: Judgment normal.     ED Results / Procedures / Treatments   Labs (all labs ordered are listed, but only abnormal results are displayed) Labs Reviewed - No data to display  EKG None  Radiology No results found.  Procedures Procedures   Medications Ordered in ED Medications - No data to display  ED Course  I have reviewed the triage vital signs and the nursing notes.  Pertinent labs & imaging  results that were available during my care of the patient were reviewed by me and considered in my medical decision making (see chart for details).    MDM Rules/Calculators/A&P                          EMR triage and nursing notes reviewed  25 year old female with history of asthma presents to the ED with  chest tightness, dry cough, wheezing and shortness of breath.  Symptoms began after she ran out of her levocetirizine and montelukast.  Symptoms feel similar to her previous asthma exacerbations.  Medical record reviewed and patient has frequent ED visits for asthma exacerbation.   History and exam is reassuring.  Normal breathing effort.  No fever, tachypnea, hypoxia.  Only faint expiratory wheezing noted.  No lower extremity edema or calf tenderness.  Given reassuring history, exam feel there is no indication for emergent lab work, imaging.  Appropriate for discharge.  Will discharge with prednisone taper, refill of levocetirizine and montelukast, refill of nebulizing treatment which I recommended she use every morning and night as well as albuterol inhaler.  Will provide contact to [facility] for her to establish care with primary care doctor for future refills.  Return precautions discussed.  Patient is comfortable this plan. Final Clinical Impression(s) / ED Diagnoses Final diagnoses:  Mild intermittent asthma with exacerbation    Rx / DC Orders ED Discharge Orders         Ordered    albuterol (VENTOLIN HFA) 108 (90 Base) MCG/ACT inhaler  Every 6 hours PRN        04/27/21 1213    albuterol (PROVENTIL) (5 MG/ML) 0.5% nebulizer solution  Every 12 hours PRN        04/27/21 1213    predniSONE (STERAPRED UNI-PAK 21 TAB) 10 MG (21) TBPK tablet  Daily        04/27/21 1213    montelukast (SINGULAIR) 5 MG chewable tablet  Daily at bedtime        04/27/21 1213    levocetirizine (XYZAL) 5 MG tablet  Every evening        04/27/21 1213           Liberty Handy,  [place] 04/27/21 1218    Little, Ambrose Finland, MD 04/28/21 [phone]

## 2021-04-27 NOTE — ED Triage Notes (Signed)
Cough/SOB and chest tightness x 1 week (since pt ran out of asthma maintenance meds d/t no insurance).

## 2021-04-27 NOTE — Discharge Instructions (Signed)
You were seen in the emergency department for cough, wheezing, chest tightness and shortness of breath.  You have very mild wheezing on exam.  You are having an asthma flare due to recent running out of your allergy medicines.  I have refilled your levocetirizine and montelukast, take these as usual.  For asthma flare symptoms please take prednisone taper as prescribed, use albuterol nebulizing treatment every morning and night, use albuterol inhaler as needed as rescue inhaler during the day.  Return to the ED for worsening symptoms, bloody cough, fever, chest pain  Please contact [facility] to establish care with a primary care doctor for future refills, physicals.  They can also refer you to an asthma and allergy specialist.

## 2021-05-27 ENCOUNTER — Emergency Department (HOSPITAL_BASED_OUTPATIENT_CLINIC_OR_DEPARTMENT_OTHER): Payer: Self-pay

## 2021-05-27 ENCOUNTER — Other Ambulatory Visit: Payer: Self-pay

## 2021-05-27 ENCOUNTER — Emergency Department (HOSPITAL_BASED_OUTPATIENT_CLINIC_OR_DEPARTMENT_OTHER)
Admission: EM | Admit: 2021-05-27 | Discharge: 2021-05-27 | Disposition: A | Discharge status: Home / Self Care | Payer: Self-pay | Attending: Emergency Medicine | Admitting: Emergency Medicine

## 2021-05-27 DIAGNOSIS — J4541 Moderate persistent asthma with (acute) exacerbation: Secondary | ICD-10-CM | POA: Insufficient documentation

## 2021-05-27 DIAGNOSIS — U071 COVID-19: Secondary | ICD-10-CM | POA: Insufficient documentation

## 2021-05-27 DIAGNOSIS — Z7951 Long term (current) use of inhaled steroids: Secondary | ICD-10-CM | POA: Insufficient documentation

## 2021-05-27 LAB — CBC WITH DIFFERENTIAL/PLATELET
Abs Immature Granulocytes: 0.02 10*3/uL (ref 0.00–0.07)
Basophils Absolute: 0 10*3/uL (ref 0.0–0.1)
Basophils Relative: 0 %
Eosinophils Absolute: 0.1 10*3/uL (ref 0.0–0.5)
Eosinophils Relative: 1 %
HCT: 35.9 % — ABNORMAL LOW (ref 36.0–46.0)
Hemoglobin: 11.2 g/dL — ABNORMAL LOW (ref 12.0–15.0)
Immature Granulocytes: 0 %
Lymphocytes Relative: 5 %
Lymphs Abs: 0.4 10*3/uL — ABNORMAL LOW (ref 0.7–4.0)
MCH: 22.9 pg — ABNORMAL LOW (ref 26.0–34.0)
MCHC: 31.2 g/dL (ref 30.0–36.0)
MCV: 73.4 fL — ABNORMAL LOW (ref 80.0–100.0)
Monocytes Absolute: 0.4 10*3/uL (ref 0.1–1.0)
Monocytes Relative: 5 %
Neutro Abs: 7.3 10*3/uL (ref 1.7–7.7)
Neutrophils Relative %: 89 %
Platelets: 412 10*3/uL — ABNORMAL HIGH (ref 150–400)
RBC: 4.89 MIL/uL (ref 3.87–5.11)
RDW: 18.6 % — ABNORMAL HIGH (ref 11.5–15.5)
WBC: 8.2 10*3/uL (ref 4.0–10.5)
nRBC: 0 % (ref 0.0–0.2)

## 2021-05-27 LAB — COMPREHENSIVE METABOLIC PANEL
ALT: 15 U/L (ref 0–44)
AST: 20 U/L (ref 15–41)
Albumin: 3.9 g/dL (ref 3.5–5.0)
Alkaline Phosphatase: 56 U/L (ref 38–126)
Anion gap: 9 (ref 5–15)
BUN: 14 mg/dL (ref 6–20)
CO2: 22 mmol/L (ref 22–32)
Calcium: 9.1 mg/dL (ref 8.9–10.3)
Chloride: 104 mmol/L (ref 98–111)
Creatinine, Ser: 0.78 mg/dL (ref 0.44–1.00)
GFR, Estimated: >60 mL/min (ref >60)
Glucose, Bld: 113 mg/dL — ABNORMAL HIGH (ref 70–99)
Potassium: 3.6 mmol/L (ref 3.5–5.1)
Sodium: 135 mmol/L (ref 135–145)
Total Bilirubin: 0.4 mg/dL (ref 0.3–1.2)
Total Protein: 7.9 g/dL (ref 6.5–8.1)

## 2021-05-27 LAB — RESP PANEL BY RT-PCR (FLU A&B, COVID) ARPGX2
Influenza A by PCR: NEGATIVE
Influenza B by PCR: NEGATIVE
SARS Coronavirus 2 by RT PCR: POSITIVE — AB

## 2021-05-27 LAB — PREGNANCY, URINE: Preg Test, Ur: NEGATIVE

## 2021-05-27 MED ORDER — ALBUTEROL SULFATE (2.5 MG/3ML) 0.083% IN NEBU
2.5000 mg | INHALATION_SOLUTION | Freq: Once | RESPIRATORY_TRACT | Status: AC
  Administered 2021-05-27: 2.5 mg via RESPIRATORY_TRACT
  Filled 2021-05-27: qty 3

## 2021-05-27 MED ORDER — ALBUTEROL SULFATE HFA 108 (90 BASE) MCG/ACT IN AERS
4.0000 | INHALATION_SPRAY | Freq: Once | RESPIRATORY_TRACT | Status: AC
  Administered 2021-05-27: 4 via RESPIRATORY_TRACT
  Filled 2021-05-27: qty 6.7

## 2021-05-27 MED ORDER — PREDNISONE 50 MG PO TABS
60.0000 mg | ORAL_TABLET | Freq: Once | ORAL | Status: AC
  Administered 2021-05-27: 60 mg via ORAL
  Filled 2021-05-27: qty 1

## 2021-05-27 MED ORDER — IPRATROPIUM-ALBUTEROL 0.5-2.5 (3) MG/3ML IN SOLN
3.0000 mL | Freq: Once | RESPIRATORY_TRACT | Status: AC
  Administered 2021-05-27: 3 mL via RESPIRATORY_TRACT
  Filled 2021-05-27: qty 3

## 2021-05-27 MED ORDER — ALBUTEROL SULFATE (2.5 MG/3ML) 0.083% IN NEBU: 2.5000 mg | INHALATION_SOLUTION | Freq: Four times a day (QID) | RESPIRATORY_TRACT | 12 refills | Status: AC | PRN

## 2021-05-27 MED ORDER — NIRMATRELVIR/RITONAVIR (PAXLOVID) TABLET (RENAL DOSING): 2.0000 | ORAL_TABLET | Freq: Two times a day (BID) | ORAL | 0 refills | Status: AC

## 2021-05-27 MED ORDER — PREDNISONE 10 MG PO TABS: 40.0000 mg | ORAL_TABLET | Freq: Every day | ORAL | 0 refills | Status: AC

## 2021-05-27 NOTE — ED Provider Notes (Signed)
[facility] EMERGENCY DEPARTMENT Provider Note   CSN: [id number] Arrival date & time: 05/27/21  0805     History Chief Complaint  Patient presents with  . Asthma    Leah Lamb is a 25 y.o. female.  HPI     Cough and dyspnea yesterday Cough productive of green sputum Shortness of breath and wheezing feel like asthma, sensation of chest/back thihtness consistent with prior asthma.  No known fevers. No congestion, no sore throat. Think triggered by allergies.  Ran out of medications as don't have insurance, and had not had asthma attack in a while and had gotten rid of nebulizer.   Reports she has been hospitalized before, never intubated or in ICU.  No leg pain or swelling, recent travel/immobilization/surgeries  No known sick contacts, is vaccinated against covid 19 but not boosted Past Medical History:  Diagnosis Date  . Anemia   . Asthma   . Obesity     There are no problems to display for this patient.   Past Surgical History:  Procedure Laterality Date  . OOPHORECTOMY Left      OB History   No obstetric history on file.     Family History  Problem Relation Age of Onset  . Asthma Neg Hx   . Allergic rhinitis Neg Hx   . Eczema Neg Hx     Social History   Tobacco Use  . Smoking status: Never Smoker  . Smokeless tobacco: Never Used  Vaping Use  . Vaping Use: Never used  Substance Use Topics  . Alcohol use: Never  . Drug use: Never    Home Medications Prior to Admission medications   Medication Sig Start Date End Date Taking? Authorizing Provider  albuterol (PROVENTIL) (2.5 MG/3ML) 0.083% nebulizer solution Take 3 mLs (2.5 mg total) by nebulization every 6 (six) hours as needed for wheezing or shortness of breath. 05/27/21  Yes Alvira Monday, MD  nirmatrelvir/ritonavir EUA, renal dosing, (PAXLOVID) TABS Take 2 tablets by mouth 2 (two) times daily for 5 days. Patient GFR is 60 . Take nirmatrelvir (150 mg) one tablet twice daily for 5  days and ritonavir (100 mg) one tablet twice daily for 5 days. 05/27/21 06/01/21 Yes Alvira Monday, MD  predniSONE (DELTASONE) 10 MG tablet Take 4 tablets (40 mg total) by mouth daily for 4 days. 05/27/21 05/31/21 Yes Alvira Monday, MD  benzonatate (TESSALON) 100 MG capsule Take 1 capsule (100 mg total) by mouth every 8 (eight) hours. 11/04/20   Placido Sou, PA-C  cetirizine (ZYRTEC) 10 MG tablet Take 1 tablet (10 mg total) by mouth daily for 14 days. 06/12/20 06/26/20  Alvira Monday, MD  hydrOXYzine (ATARAX/VISTARIL) 25 MG tablet Take 1 tablet (25 mg total) by mouth every 6 (six) hours as needed for itching (allergy symptoms). 01/04/20   Long, Arlyss Repress, MD  levocetirizine (XYZAL) 5 MG tablet Take 1 tablet (5 mg total) by mouth every evening. 04/27/21 05/27/21  Liberty Handy, PA-C  methylPREDNISolone (MEDROL DOSEPAK) 4 MG TBPK tablet Use as directed 11/08/20   Jacalyn Lefevre, MD  mometasone-formoterol [facility] 200-5 MCG/ACT AERO Inhale 2 puffs into the lungs 2 (two) times daily. 06/12/20   Marcelyn Bruins, MD  montelukast (SINGULAIR) 5 MG chewable tablet Chew 2 tablets (10 mg total) by mouth at bedtime. 04/27/21 05/27/21  Liberty Handy, PA-C  budesonide-formoterol (SYMBICORT) 160-4.5 MCG/ACT inhaler Inhale 2 puffs into the lungs 2 (two) times daily. 01/11/20 09/17/20  Marcelyn Bruins, MD  Allergies    Amoxicillin and Motrin [ibuprofen]  Review of Systems   Review of Systems  Constitutional: Negative for fever.  HENT: Negative for sore throat.   Eyes: Negative for visual disturbance.  Respiratory: Positive for cough, shortness of breath and wheezing.   Cardiovascular: Negative for chest pain.  Gastrointestinal: Negative for abdominal pain, nausea and vomiting.  Genitourinary: Negative for difficulty urinating.  Musculoskeletal: Negative for back pain and neck pain.  Skin: Negative for rash.  Neurological: Negative for syncope and headaches.    Physical Exam Updated  Vital Signs BP 133/74   Pulse (!) 105   Temp 98.6 F (37 C) (Oral)   Resp 17   Ht 5\' 2"  (1.575 m)   Wt 93.9 kg   LMP 05/24/2021 (Exact Date)   SpO2 97%   BMI 37.86 kg/m   Physical Exam Vitals and nursing note reviewed.  Constitutional:      General: She is not in acute distress.    Appearance: She is well-developed. She is not diaphoretic.  HENT:     Head: Normocephalic and atraumatic.  Eyes:     Conjunctiva/sclera: Conjunctivae normal.  Cardiovascular:     Rate and Rhythm: Normal rate and regular rhythm.     Heart sounds: Normal heart sounds. No murmur heard. No friction rub. No gallop.   Pulmonary:     Effort: Pulmonary effort is normal. No respiratory distress.     Breath sounds: Normal breath sounds. No wheezing or rales.  Abdominal:     General: There is no distension.     Palpations: Abdomen is soft.     Tenderness: There is no abdominal tenderness. There is no guarding.  Musculoskeletal:        General: No tenderness.     Cervical back: Normal range of motion.  Skin:    General: Skin is warm and dry.     Findings: No erythema or rash.  Neurological:     Mental Status: She is alert and oriented to person, place, and time.     ED Results / Procedures / Treatments   Labs (all labs ordered are listed, but only abnormal results are displayed) Labs Reviewed  RESP PANEL BY RT-PCR (FLU A&B, COVID) ARPGX2 - Abnormal; Notable for the following components:      Result Value   SARS Coronavirus 2 by RT PCR POSITIVE (*)    All other components within normal limits  CBC WITH DIFFERENTIAL/PLATELET - Abnormal; Notable for the following components:   Hemoglobin 11.2 (*)    HCT 35.9 (*)    MCV 73.4 (*)    MCH 22.9 (*)    RDW 18.6 (*)    Platelets 412 (*)    Lymphs Abs 0.4 (*)    All other components within normal limits  COMPREHENSIVE METABOLIC PANEL - Abnormal; Notable for the following components:   Glucose, Bld 113 (*)    All other components within normal limits   PREGNANCY, URINE    EKG EKG Interpretation  Date/Time:  Wednesday May 27 2021 08:57:04 EDT Ventricular Rate:  102 PR Interval:  129 QRS Duration: 85 QT Interval:  336 QTC Calculation: 438 R Axis:   80 Text Interpretation: Sinus tachycardia LAE, consider biatrial enlargement RSR' in V1 or V2, right VCD or RVH No significant change since last tracing Confirmed by [id number] (Alvira Monday) on 05/27/2021 10:18:40 AM   Radiology DG Chest Portable 1 View  Result Date: 05/27/2021 CLINICAL DATA:  Shortness of breath, asthma EXAM: PORTABLE CHEST  1 VIEW COMPARISON:  11/04/2020 FINDINGS: The heart size and mediastinal contours are within normal limits. Mildly increased bibasilar interstitial markings. No lobar consolidation. No pleural effusion or pneumothorax. The visualized skeletal structures are unremarkable. IMPRESSION: Mildly increased bibasilar interstitial markings, which may represent bronchitic type lung changes. Developing atypical/viral infection not excluded. Electronically Signed   By: Duanne Guess D.O.   On: 05/27/2021 09:16    Procedures Procedures   Medications Ordered in ED Medications  ipratropium-albuterol (DUONEB) 0.5-2.5 (3) MG/3ML nebulizer solution 3 mL (3 mLs Nebulization Given 05/27/21 0838)  albuterol (PROVENTIL) (2.5 MG/3ML) 0.083% nebulizer solution 2.5 mg (2.5 mg Nebulization Given 05/27/21 0838)  predniSONE (DELTASONE) tablet 60 mg (60 mg Oral Given 05/27/21 0843)  albuterol (VENTOLIN HFA) 108 (90 Base) MCG/ACT inhaler 4 puff (4 puffs Inhalation Given 05/27/21 0955)    ED Course  I have reviewed the triage vital signs and the nursing notes.  Pertinent labs & imaging results that were available during my care of the patient were reviewed by me and considered in my medical decision making (see chart for details).    MDM Rules/Calculators/A&P                          25yo female presents with cough dyspnea wheezing.  XR without pneumonia or pneumothorax, is  consistent with bronchitic changes or viral infection. Low suspicion for PE given no hypoxia, no asymmetric leg swelling or immobilization.  Symptoms consistent with asthma. Improved following nebulizer, prednisone, and MDI in ED.  COVID test returned positive. She is a candidate for paxlovid--checked labs to evaluate baseline Cr.  Also note no significant anemia or other abnormalities to contribute to dyspnea.   Normal oxygenation, stable for outpatient treatment of COVID 19 with paxlovid. Patient discharged in stable condition with understanding of reasons to return.   Final Clinical Impression(s) / ED Diagnoses Final diagnoses:  Moderate persistent asthma with exacerbation  COVID-19    Rx / DC Orders ED Discharge Orders         Ordered    For home use only DME Nebulizer machine        05/27/21 1125    albuterol (PROVENTIL) (2.5 MG/3ML) 0.083% nebulizer solution  Every 6 hours PRN        05/27/21 1125    predniSONE (DELTASONE) 10 MG tablet  Daily        05/27/21 1125    nirmatrelvir/ritonavir EUA, renal dosing, (PAXLOVID) TABS  2 times daily        05/27/21 1125           Alvira Monday, MD 05/28/21 [phone]

## 2021-05-27 NOTE — ED Triage Notes (Signed)
Patient continues to have shortness of breath. Cough with some green mucous production.

## 2021-06-05 ENCOUNTER — Emergency Department (HOSPITAL_BASED_OUTPATIENT_CLINIC_OR_DEPARTMENT_OTHER): Payer: Self-pay

## 2021-06-05 ENCOUNTER — Emergency Department (HOSPITAL_BASED_OUTPATIENT_CLINIC_OR_DEPARTMENT_OTHER)
Admission: EM | Admit: 2021-06-05 | Discharge: 2021-06-05 | Disposition: A | Discharge status: Home / Self Care | Payer: Self-pay | Attending: Emergency Medicine | Admitting: Emergency Medicine

## 2021-06-05 ENCOUNTER — Other Ambulatory Visit: Payer: Self-pay

## 2021-06-05 DIAGNOSIS — R0602 Shortness of breath: Secondary | ICD-10-CM

## 2021-06-05 DIAGNOSIS — Z7951 Long term (current) use of inhaled steroids: Secondary | ICD-10-CM | POA: Insufficient documentation

## 2021-06-05 DIAGNOSIS — J45901 Unspecified asthma with (acute) exacerbation: Secondary | ICD-10-CM | POA: Insufficient documentation

## 2021-06-05 MED ORDER — PREDNISONE 50 MG PO TABS: 50.0000 mg | ORAL_TABLET | Freq: Every day | ORAL | 0 refills | Status: DC

## 2021-06-05 NOTE — ED Triage Notes (Signed)
Shortness of breath x 2 days worse with motion, Covid + on 05/27/2021. Chest pain .

## 2021-06-05 NOTE — ED Provider Notes (Signed)
[facility] EMERGENCY DEPARTMENT Provider Note   CSN: [id number] Arrival date & time: 06/05/21  7510     History Chief Complaint  Patient presents with   Shortness of Breath    Leah Lamb is a 25 y.o. female.  She has a history of asthma.  Non-smoker.  Was diagnosed with COVID 9 days ago.  Received the Paxlovid  She said she was feeling improvement in her shortness of breath but over the last few days has gotten worse again.  Nonproductive cough.  No fevers nausea vomiting diarrhea.  Shortness of breath worse with exertion  The history is provided by the patient.  Shortness of Breath Severity:  Moderate Onset quality:  Gradual Duration:  2 days Timing:  Intermittent Progression:  Unchanged Chronicity:  Recurrent Context: activity   Relieved by:  Nothing Worsened by:  Activity Ineffective treatments:  Inhaler Associated symptoms: chest pain and cough   Associated symptoms: no abdominal pain, no fever, no headaches, no hemoptysis, no rash, no sore throat, no sputum production, no syncope and no vomiting       Past Medical History:  Diagnosis Date   Anemia    Asthma    Obesity     There are no problems to display for this patient.   Past Surgical History:  Procedure Laterality Date   OOPHORECTOMY Left      OB History   No obstetric history on file.     Family History  Problem Relation Age of Onset   Asthma Neg Hx    Allergic rhinitis Neg Hx    Eczema Neg Hx     Social History   Tobacco Use   Smoking status: Never   Smokeless tobacco: Never  Vaping Use   Vaping Use: Never used  Substance Use Topics   Alcohol use: Never   Drug use: Never    Home Medications Prior to Admission medications   Medication Sig Start Date End Date Taking? Authorizing Provider  albuterol (PROVENTIL) (2.5 MG/3ML) 0.083% nebulizer solution Take 3 mLs (2.5 mg total) by nebulization every 6 (six) hours as needed for wheezing or shortness of breath. 05/27/21    Alvira Monday, MD  benzonatate (TESSALON) 100 MG capsule Take 1 capsule (100 mg total) by mouth every 8 (eight) hours. 11/04/20   Placido Sou, PA-C  cetirizine (ZYRTEC) 10 MG tablet Take 1 tablet (10 mg total) by mouth daily for 14 days. 06/12/20 06/26/20  Alvira Monday, MD  hydrOXYzine (ATARAX/VISTARIL) 25 MG tablet Take 1 tablet (25 mg total) by mouth every 6 (six) hours as needed for itching (allergy symptoms). 01/04/20   Long, Arlyss Repress, MD  levocetirizine (XYZAL) 5 MG tablet Take 1 tablet (5 mg total) by mouth every evening. 04/27/21 05/27/21  Liberty Handy, PA-C  methylPREDNISolone (MEDROL DOSEPAK) 4 MG TBPK tablet Use as directed 11/08/20   Jacalyn Lefevre, MD  mometasone-formoterol [facility]) 200-5 MCG/ACT AERO Inhale 2 puffs into the lungs 2 (two) times daily. 06/12/20   Marcelyn Bruins, MD  montelukast (SINGULAIR) 5 MG chewable tablet Chew 2 tablets (10 mg total) by mouth at bedtime. 04/27/21 05/27/21  Liberty Handy, PA-C  budesonide-formoterol (SYMBICORT) 160-4.5 MCG/ACT inhaler Inhale 2 puffs into the lungs 2 (two) times daily. 01/11/20 09/17/20  Marcelyn Bruins, MD    Allergies    Amoxicillin and Motrin [ibuprofen]  Review of Systems   Review of Systems  Constitutional:  Negative for fever.  HENT:  Negative for sore throat.   Eyes:  Negative for visual disturbance.  Respiratory:  Positive for cough and shortness of breath. Negative for hemoptysis and sputum production.   Cardiovascular:  Positive for chest pain. Negative for syncope.  Gastrointestinal:  Negative for abdominal pain and vomiting.  Genitourinary:  Negative for dysuria.  Musculoskeletal:  Negative for gait problem.  Skin:  Negative for rash.  Neurological:  Negative for headaches.   Physical Exam Updated Vital Signs BP 130/69 (BP Location: Right Arm)   Pulse 80   Temp 98.4 F (36.9 C) (Oral)   Resp 18   LMP 05/24/2021 (Exact Date)   SpO2 97%   Physical Exam Vitals and nursing note  reviewed.  Constitutional:      General: She is not in acute distress.    Appearance: She is well-developed.  HENT:     Head: Normocephalic and atraumatic.  Eyes:     Conjunctiva/sclera: Conjunctivae normal.  Cardiovascular:     Rate and Rhythm: Normal rate and regular rhythm.     Heart sounds: No murmur heard. Pulmonary:     Effort: Pulmonary effort is normal. No respiratory distress.     Breath sounds: Wheezing (few scattered) present.  Abdominal:     Palpations: Abdomen is soft.     Tenderness: There is no abdominal tenderness.  Musculoskeletal:        General: Normal range of motion.     Cervical back: Neck supple.     Right lower leg: No tenderness. No edema.     Left lower leg: No tenderness. No edema.  Skin:    General: Skin is warm and dry.     Capillary Refill: Capillary refill takes less than 2 seconds.  Neurological:     General: No focal deficit present.     Mental Status: She is alert.    ED Results / Procedures / Treatments   Labs (all labs ordered are listed, but only abnormal results are displayed) Labs Reviewed - No data to display  EKG EKG Interpretation  Date/Time:  Friday June 05 2021 07:57:37 EDT Ventricular Rate:  81 PR Interval:  138 QRS Duration: 85 QT Interval:  382 QTC Calculation: 444 R Axis:   71 Text Interpretation: Sinus rhythm No significant change since last tracing Confirmed by Meridee Score [phone]) on 06/05/2021 8:00:25 AM  Radiology DG Chest Port 1 View  Result Date: 06/05/2021 CLINICAL DATA:  Shortness of breath EXAM: PORTABLE CHEST 1 VIEW COMPARISON:  May 27, 2021 FINDINGS: Lungs are clear. Heart size and pulmonary vascularity are normal. No adenopathy. No bone lesions. IMPRESSION: Lungs clear.  Cardiac silhouette normal. Electronically Signed   By: Bretta Bang III M.D.   On: 06/05/2021 08:40    Procedures Procedures   Medications Ordered in ED Medications - No data to display  ED Course  I have reviewed the triage  vital signs and the nursing notes.  Pertinent labs & imaging results that were available during my care of the patient were reviewed by me and considered in my medical decision making (see chart for details).  Clinical Course as of 06/05/21 1748  Fri Jun 05, 2021  2353 Chest x-ray interpreted by me as no acute pulmonary disease.  Patient has a few scattered wheezes on exam.  History of asthma.  No indications for admission.  Will cover with some Steroids for possible asthma exacerbation. [MB]    Clinical Course User Index [MB] Terrilee Files, MD   MDM Rules/Calculators/A&P  Leah Lamb was evaluated in Emergency Department on 06/05/2021 for the symptoms described in the history of present illness. She was evaluated in the context of the global COVID-19 pandemic, which necessitated consideration that the patient might be at risk for infection with the SARS-CoV-2 virus that causes COVID-19. Institutional protocols and algorithms that pertain to the evaluation of patients at risk for COVID-19 are in a state of rapid change based on information released by regulatory bodies including the CDC and federal and state organizations. These policies and algorithms were followed during the patient's care in the ED.  This patient complains of cough shortness of breath; this involves an extensive number of treatment Options and is a complaint that carries with it a high risk of complications and Morbidity. The differential includes COVID, pneumonia, pneumothorax, PE  I ordered imaging studies which included chest x-ray and I independently    visualized and interpreted imaging which showed no acute infiltrates Previous records obtained and reviewed in epic, no recent visits  After the interventions stated above, I reevaluated the patient and found patient to be satting well on room air with normal respiratory rate and no distress.  No indications for admission or further  testing at this time.  Chest x-ray and EKG benign.  Reviewed with patient.  Due to her history of asthma will cover with short course of some steroids.   Final Clinical Impression(s) / ED Diagnoses Final diagnoses:  Exacerbation of asthma, unspecified asthma severity, unspecified whether persistent    Rx / DC Orders ED Discharge Orders          Ordered    predniSONE (DELTASONE) 50 MG tablet  Daily        06/05/21 0906             Terrilee Files, MD 06/05/21 1750

## 2021-06-05 NOTE — ED Notes (Signed)
Ambulated with pulse ox to room, SAT 95%. No distress noted. MD aware

## 2021-06-05 NOTE — Discharge Instructions (Addendum)
You were seen in the emergency department for increased shortness of breath in the setting of a recent COVID infection.  Your chest x-ray did not show any signs of pneumonia.  Your oxygen level was stable.  We are putting you back on some prednisone for a few days.  Please continue to use your nebulizer.  Return to the emergency department for any worsening or concerning symptoms.

## 2021-06-14 ENCOUNTER — Other Ambulatory Visit: Payer: Self-pay

## 2021-06-14 ENCOUNTER — Encounter (HOSPITAL_BASED_OUTPATIENT_CLINIC_OR_DEPARTMENT_OTHER): Payer: Self-pay

## 2021-06-14 ENCOUNTER — Emergency Department (HOSPITAL_BASED_OUTPATIENT_CLINIC_OR_DEPARTMENT_OTHER)
Admission: EM | Admit: 2021-06-14 | Discharge: 2021-06-14 | Disposition: A | Discharge status: Home / Self Care | Payer: Self-pay | Attending: Emergency Medicine | Admitting: Emergency Medicine

## 2021-06-14 DIAGNOSIS — Z7951 Long term (current) use of inhaled steroids: Secondary | ICD-10-CM | POA: Insufficient documentation

## 2021-06-14 DIAGNOSIS — J4541 Moderate persistent asthma with (acute) exacerbation: Secondary | ICD-10-CM | POA: Insufficient documentation

## 2021-06-14 DIAGNOSIS — Z8616 Personal history of COVID-19: Secondary | ICD-10-CM | POA: Insufficient documentation

## 2021-06-14 DIAGNOSIS — J45901 Unspecified asthma with (acute) exacerbation: Secondary | ICD-10-CM

## 2021-06-14 MED ORDER — PREDNISONE 50 MG PO TABS
60.0000 mg | ORAL_TABLET | Freq: Once | ORAL | Status: AC
  Administered 2021-06-14: 60 mg via ORAL
  Filled 2021-06-14: qty 1

## 2021-06-14 MED ORDER — PREDNISONE 10 MG PO TABS: 40.0000 mg | ORAL_TABLET | Freq: Every day | ORAL | 0 refills | Status: DC

## 2021-06-14 MED ORDER — ALBUTEROL SULFATE HFA 108 (90 BASE) MCG/ACT IN AERS
2.0000 | INHALATION_SPRAY | Freq: Four times a day (QID) | RESPIRATORY_TRACT | Status: DC
  Administered 2021-06-14: 2 via RESPIRATORY_TRACT
  Filled 2021-06-14: qty 6.7

## 2021-06-14 MED ORDER — IPRATROPIUM-ALBUTEROL 0.5-2.5 (3) MG/3ML IN SOLN
RESPIRATORY_TRACT | Status: AC
  Filled 2021-06-14: qty 3

## 2021-06-14 MED ORDER — IPRATROPIUM-ALBUTEROL 0.5-2.5 (3) MG/3ML IN SOLN
3.0000 mL | RESPIRATORY_TRACT | Status: AC
  Administered 2021-06-14: 3 mL via RESPIRATORY_TRACT

## 2021-06-14 MED ORDER — AEROCHAMBER PLUS FLO-VU MEDIUM MISC
1.0000 | Freq: Once | Status: AC
  Administered 2021-06-14: 1
  Filled 2021-06-14: qty 1

## 2021-06-14 MED ORDER — IPRATROPIUM-ALBUTEROL 0.5-2.5 (3) MG/3ML IN SOLN: 3.0000 mL | Freq: Once | RESPIRATORY_TRACT | Status: DC

## 2021-06-14 NOTE — ED Provider Notes (Signed)
[facility] EMERGENCY DEPARTMENT Provider Note   CSN: [id number] Arrival date & time: 06/14/21  1855     History Chief Complaint  Patient presents with   Asthma    Leah Lamb is a 25 y.o. female.  Patient had COVID be on June 1.  Since that time patient's been having a lot of trouble with her asthma.  She had pre-existing asthma.  Patient was seen June 1 also seen June 10.  Been put on courses of prednisone.  Does have a nebulizer machine at home.  Patient states he asthma just keeps coming back.  Patient upon arrival here in no real distress.  Respiratory rate 20 oxygen sats 98%.  Not febrile.  Patient was wheezing bilaterally.  Patient had a chest x-ray done June 1 and June 10.  X-ray on June 10 was normal.  June 1 x-ray raise some question of atypical pneumonia findings.  But that is when she was diagnosed with COVID.      Past Medical History:  Diagnosis Date   Anemia    Asthma    Obesity     There are no problems to display for this patient.   Past Surgical History:  Procedure Laterality Date   OOPHORECTOMY Left      OB History   No obstetric history on file.     Family History  Problem Relation Age of Onset   Asthma Neg Hx    Allergic rhinitis Neg Hx    Eczema Neg Hx     Social History   Tobacco Use   Smoking status: Never   Smokeless tobacco: Never  Vaping Use   Vaping Use: Never used  Substance Use Topics   Alcohol use: Never   Drug use: Never    Home Medications Prior to Admission medications   Medication Sig Start Date End Date Taking? Authorizing Provider  predniSONE (DELTASONE) 10 MG tablet Take 4 tablets (40 mg total) by mouth daily. 06/14/21  Yes Vanetta Mulders, MD  albuterol (PROVENTIL) (2.5 MG/3ML) 0.083% nebulizer solution Take 3 mLs (2.5 mg total) by nebulization every 6 (six) hours as needed for wheezing or shortness of breath. 05/27/21   Alvira Monday, MD  benzonatate (TESSALON) 100 MG capsule Take 1 capsule  (100 mg total) by mouth every 8 (eight) hours. 11/04/20   Placido Sou, PA-C  cetirizine (ZYRTEC) 10 MG tablet Take 1 tablet (10 mg total) by mouth daily for 14 days. 06/12/20 06/26/20  Alvira Monday, MD  hydrOXYzine (ATARAX/VISTARIL) 25 MG tablet Take 1 tablet (25 mg total) by mouth every 6 (six) hours as needed for itching (allergy symptoms). 01/04/20   Long, Arlyss Repress, MD  levocetirizine (XYZAL) 5 MG tablet Take 1 tablet (5 mg total) by mouth every evening. 04/27/21 05/27/21  Liberty Handy, PA-C  methylPREDNISolone (MEDROL DOSEPAK) 4 MG TBPK tablet Use as directed 11/08/20   Jacalyn Lefevre, MD  mometasone-formoterol [facility]) 200-5 MCG/ACT AERO Inhale 2 puffs into the lungs 2 (two) times daily. 06/12/20   Marcelyn Bruins, MD  montelukast (SINGULAIR) 5 MG chewable tablet Chew 2 tablets (10 mg total) by mouth at bedtime. 04/27/21 05/27/21  Liberty Handy, PA-C  predniSONE (DELTASONE) 50 MG tablet Take 1 tablet (50 mg total) by mouth daily. 06/05/21   Terrilee Files, MD  budesonide-formoterol [facility]) 160-4.5 MCG/ACT inhaler Inhale 2 puffs into the lungs 2 (two) times daily. 01/11/20 09/17/20  Marcelyn Bruins, MD    Allergies    Amoxicillin and Motrin [  ibuprofen]  Review of Systems   Review of Systems  Constitutional:  Negative for chills and fever.  HENT:  Negative for ear pain and sore throat.   Eyes:  Negative for pain and visual disturbance.  Respiratory:  Positive for shortness of breath and wheezing. Negative for cough.   Cardiovascular:  Negative for chest pain and palpitations.  Gastrointestinal:  Negative for abdominal pain and vomiting.  Genitourinary:  Negative for dysuria and hematuria.  Musculoskeletal:  Negative for arthralgias and back pain.  Skin:  Negative for color change and rash.  Neurological:  Negative for seizures and syncope.  All other systems reviewed and are negative.  Physical Exam Updated Vital Signs BP 121/60   Pulse 76   Temp 98.4  F (36.9 C) (Oral)   Resp (!) 22   Ht 1.575 m (5\' 2" )   Wt 93.9 kg   LMP 05/24/2021 (Exact Date)   SpO2 98%   BMI 37.86 kg/m   Physical Exam Vitals and nursing note reviewed.  Constitutional:      General: She is not in acute distress.    Appearance: Normal appearance. She is well-developed.  HENT:     Head: Normocephalic and atraumatic.  Eyes:     Extraocular Movements: Extraocular movements intact.     Conjunctiva/sclera: Conjunctivae normal.     Pupils: Pupils are equal, round, and reactive to light.  Cardiovascular:     Rate and Rhythm: Normal rate and regular rhythm.     Heart sounds: No murmur heard. Pulmonary:     Effort: Pulmonary effort is normal. No respiratory distress.     Breath sounds: Wheezing present. No rhonchi or rales.  Abdominal:     Palpations: Abdomen is soft.     Tenderness: There is no abdominal tenderness.  Musculoskeletal:        General: No swelling.     Cervical back: Normal range of motion and neck supple.  Skin:    General: Skin is warm and dry.  Neurological:     General: No focal deficit present.     Mental Status: She is alert and oriented to person, place, and time.     Cranial Nerves: No cranial nerve deficit.     Sensory: No sensory deficit.     Motor: No weakness.    ED Results / Procedures / Treatments   Labs (all labs ordered are listed, but only abnormal results are displayed) Labs Reviewed - No data to display  EKG None  Radiology No results found.  Procedures Procedures   Medications Ordered in ED Medications  ipratropium-albuterol (DUONEB) 0.5-2.5 (3) MG/3ML nebulizer solution 3 mL (3 mLs Nebulization Not Given 06/14/21 2017)  predniSONE (DELTASONE) tablet 60 mg (has no administration in time range)  albuterol (VENTOLIN HFA) 108 (90 Base) MCG/ACT inhaler 2 puff (has no administration in time range)  ipratropium-albuterol (DUONEB) 0.5-2.5 (3) MG/3ML nebulizer solution 3 mL (3 mLs Nebulization Given 06/14/21 1923)     ED Course  I have reviewed the triage vital signs and the nursing notes.  Pertinent labs & imaging results that were available during my care of the patient were reviewed by me and considered in my medical decision making (see chart for details).    MDM Rules/Calculators/A&P                          Patient received duo nebulizer here wheezing all resolved.  Patient received 60 mg of prednisone.  We will  continue to treat her with prednisone for 5 days at home and she is requesting an albuterol inhaler to go.  She does have solutions for her nebulizer treatment.  Recommending that she uses either inhaler or nebulizer every 6 hours.   Final Clinical Impression(s) / ED Diagnoses Final diagnoses:  Moderate asthma with exacerbation, unspecified whether persistent    Rx / DC Orders ED Discharge Orders          Ordered    predniSONE (DELTASONE) 10 MG tablet  Daily        06/14/21 2045             Vanetta Mulders, MD 06/14/21 2050

## 2021-06-14 NOTE — ED Triage Notes (Signed)
Pt with multiple visits in past 15 days for asthma exacerbation. Pt recent hx of COVID and has since been declared non-infectious. Pt reports continued tightness and ShOB.

## 2021-06-14 NOTE — Discharge Instructions (Addendum)
Take the prednisone as directed for the next 5 days.  Use albuterol inhaler or your nebulizer every 6 hours at least for the next 7 days.  Then as needed.  Turn for any new or worse symptoms.  Follow-up information provided to make an appointment with the wellness clinic in [place].

## 2021-07-10 ENCOUNTER — Other Ambulatory Visit: Payer: Self-pay

## 2021-07-10 ENCOUNTER — Other Ambulatory Visit: Payer: Self-pay | Admitting: Allergy

## 2021-07-10 ENCOUNTER — Ambulatory Visit (INDEPENDENT_AMBULATORY_CARE_PROVIDER_SITE_OTHER): Payer: No Typology Code available for payment source | Admitting: Allergy

## 2021-07-10 ENCOUNTER — Encounter: Payer: Self-pay | Admitting: Allergy

## 2021-07-10 VITALS — BP 96/60 | HR 89 | Temp 98.1°F | Resp 20

## 2021-07-10 DIAGNOSIS — H1013 Acute atopic conjunctivitis, bilateral: Secondary | ICD-10-CM

## 2021-07-10 DIAGNOSIS — J4550 Severe persistent asthma, uncomplicated: Secondary | ICD-10-CM

## 2021-07-10 DIAGNOSIS — J3089 Other allergic rhinitis: Secondary | ICD-10-CM

## 2021-07-10 DIAGNOSIS — T781XXD Other adverse food reactions, not elsewhere classified, subsequent encounter: Secondary | ICD-10-CM

## 2021-07-10 MED ORDER — ALBUTEROL SULFATE HFA 108 (90 BASE) MCG/ACT IN AERS: 2.0000 | INHALATION_SPRAY | Freq: Four times a day (QID) | RESPIRATORY_TRACT | 1 refills | Status: DC | PRN

## 2021-07-10 MED ORDER — LEVOCETIRIZINE DIHYDROCHLORIDE 5 MG PO TABS: 5.0000 mg | ORAL_TABLET | Freq: Every evening | ORAL | 5 refills | Status: DC

## 2021-07-10 MED ORDER — MONTELUKAST SODIUM 10 MG PO TABS: 10.0000 mg | ORAL_TABLET | Freq: Every day | ORAL | 5 refills | Status: DC

## 2021-07-10 MED ORDER — TRELEGY ELLIPTA 200-62.5-25 MCG/INH IN AEPB: 1.0000 | INHALATION_SPRAY | Freq: Every day | RESPIRATORY_TRACT | 5 refills | Status: AC

## 2021-07-10 NOTE — Patient Instructions (Addendum)
Asthma  - very poorly controlled at this time  - start Trelegy 1 puff daily.  This is a triple therapy maintenance controller medication.  Sample provided and pt demonstrated proper use  - continue Singulair 10mg  daily at bedtime.    - have access to albuterol inhaler 2 puffs or albuterol 1 vial via nebulizer every 4-6 hours as needed for cough/wheeze/shortness of breath/chest tightness.  May use 15-20 minutes prior to activity.   Monitor frequency of use.    - use albuterol with space device  - step-up asthma therapy also discussed today including Tezspire as add-on therapy.  Discussed benefits, risks and monthly injection protocol.  Will see if this is covered by insurance.  You will be notified by , our biologic coordinator, in regards to insurance coverage.    Asthma control goals:  Full participation in all desired activities (may need albuterol before activity) Albuterol use two time or less a week on average (not counting use with activity) Cough interfering with sleep two time or less a month Oral steroids no more than once a year No hospitalizations  Adverse food reaction  -  continue avoidance/moderation of banana and avocado in the diet.  You had negative IgE blood testing for banana and avocado  - banana and avocado can be associated with a latex allergy thus would be cautious with latex exposure.   It also could be related to pollen food allergy syndrome since you are sensitive to pollens   Allergies  - environmental allergy panel was positive to cat, dog, grass pollen, tree pollen, weed pollen, mold.  Provided with allergen avoidance measures.   - can use over-the-counter Pataday 1 drop each eye daily as needed for itchy eyes  - continue levocetirizine 5mg  daily as needed  Follow-up in 2 months or sooner if needed

## 2021-07-10 NOTE — Progress Notes (Signed)
Follow-up Note  RE: Leah Lamb MRN: [id number] DOB: 05/25/1996 Date of Office Visit: 07/10/2021   History of present illness: Leah Lamb is a 25 y.o. female presenting today for follow-up of asthma.  She also has history of adverse food reaction and allergic conjunctivitis.  She was last seen in the office on 01/11/20 by myself for her new patient visit.   She has been without insurance for about 5 months this year and thus has been without her maintenance inhaler.  She only currently has albuterol via nebulizer and singulair daily.  She states she has about 20 asthma attacks a day.  She has been to ED and has has many courses of prednisone for symptom control.  Her most recent ED visit was on 06/14/21 and in our [facility] system there has been 13 visits are asthma flares since her last visit.  She no longer has cat in the home.  She feels her symptoms now are likely related to not being on any maintenance medications.  Symptoms mostly consist of shortness of breath, wheezing, mucus production, cough.   She does report the pataday eye drop was helpful is controlling itchy eyes.  She does report taking levocetirizine for allergy symptom control. She still avoids banana but will eat avocado on occasion states her throat does still get a bit itchy with ingestion.  Review of systems: Review of Systems  Constitutional: Negative.   HENT: Negative.    Eyes: Negative.   Respiratory:  Positive for cough, sputum production, shortness of breath and wheezing.   Cardiovascular: Negative.   Gastrointestinal: Negative.   Musculoskeletal: Negative.   Skin: Negative.   Neurological: Negative.    All other systems negative unless noted above in HPI  Past medical/social/surgical/family history have been reviewed and are unchanged unless specifically indicated below.  No changes  Medication List: Current Outpatient Medications  Medication Sig Dispense Refill   albuterol (PROVENTIL) (2.5  MG/3ML) 0.083% nebulizer solution Take 3 mLs (2.5 mg total) by nebulization every 6 (six) hours as needed for wheezing or shortness of breath. 75 mL 12   albuterol (VENTOLIN HFA) 108 (90 Base) MCG/ACT inhaler Inhale 2 puffs into the lungs every 6 (six) hours as needed for wheezing or shortness of breath. 18 g 1   levocetirizine (XYZAL) 5 MG tablet Take 1 tablet (5 mg total) by mouth every evening. 30 tablet 5   montelukast (SINGULAIR) 10 MG tablet Take 1 tablet (10 mg total) by mouth at bedtime. 30 tablet 5   TRELEGY ELLIPTA 200-62.5-25 MCG/INH AEPB Inhale 1 puff into the lungs daily. 28 each 5   levocetirizine (XYZAL) 5 MG tablet Take 1 tablet (5 mg total) by mouth every evening. 30 tablet 0   No current facility-administered medications for this visit.     Known medication allergies: Allergies  Allergen Reactions   Amoxicillin Hives   Motrin [Ibuprofen] Itching     Physical examination: Blood pressure 96/60, pulse 89, temperature 98.1 F (36.7 C), temperature source Temporal, resp. rate 20, SpO2 97 %.  General: Alert, interactive, in no acute distress. HEENT: PERRLA, TMs pearly gray, turbinates mildly edematous with clear discharge, post-pharynx non erythematous. Neck: Supple without lymphadenopathy. Lungs: Decreased breath sounds with expiratory wheezing bilaterally. {increased work of breathing.  Work of breathing is improved status post DuoNeb as well as wheezes now resolved CV: Normal S1, S2 without murmurs. Abdomen: Nondistended, nontender. Skin: Warm and dry, without lesions or rashes. Extremities:  No clubbing, cyanosis or edema.  Neuro:   Grossly intact.  Diagnositics/Labs: ACT score 6 which indicates very poor control   Spirometry: FEV1: 0.97L 34%, FVC: 1.87L 57%, ratio consistent with severe obstructive with restrictive pattern .  Status post DuoNeb she had a 96% improvement in FEV1 to 1.9L which is very significant   Component     Latest Ref Rng & Units 01/11/2020   IgE (Immunoglobulin E), Serum     6 - 495 IU/mL 23  D Pteronyssinus IgE     Class 0 kU/L <0.10  D Farinae IgE     Class 0 kU/L <0.10  Cat Dander IgE     Class IV kU/L 4.03 (A)  Dog Dander IgE     Class II kU/L 0.91 (A)  [place] Grass IgE     Class 0 kU/L <0.10  Timothy Grass IgE     Class 0 kU/L <0.10  Johnson Grass IgE     Class 0 kU/L <0.10  B[place]ia Grass IgE     Class 0/I kU/L 0.18 (A)  Cockroach, American IgE     Class 0 kU/L <0.10  Penicillium Chrysogen IgE     Class 0/I kU/L 0.12 (A)  Cladosporium Herbarum IgE     Class 0/I kU/L 0.21 (A)  Aspergillus Fumigatus IgE     Class II kU/L 0.64 (A)  Mucor Racemosus IgE     Class 0 kU/L <0.10  Alternaria Alternata IgE     Class 0 kU/L <0.10  Stemphylium Herbarum IgE     Class 0/I kU/L 0.15 (A)  Common Silver Charletta Cousin IgE     Class 0 kU/L <0.10  Oak, Hagstrom IgE     Class 0/I kU/L 0.18 (A)  Elm, American IgE     Class 0/I kU/L 0.18 (A)  Maple/Box Elder IgE     Class 0 kU/L <0.10  Hickory, B[place]by IgE     Class 0 kU/L <0.10  Amer Sycamore IgE Qn     Class 0 kU/L <0.10  Carachure Mulberry IgE     Class 0 kU/L <0.10  Sweet gum IgE RAST Ql     Class 0 kU/L <0.10  Cedar, [place] IgE     Class 0 kU/L <0.10  Ragweed, Short IgE     Class 0 kU/L <0.10  Mugwort IgE Qn     Class 0 kU/L <0.10  Plantain, English IgE     Class 0/I kU/L 0.12 (A)  Pigweed, Rough IgE     Class 0 kU/L <0.10  Sheep Sorrel IgE Qn     Class 0/I kU/L 0.15 (A)  Nettle IgE     Class 0 kU/L <0.10  WBC     3.4 - 10.8 x10E3/uL 5.0  RBC     3.77 - 5.28 x10E6/uL 4.57  Hemoglobin     11.1 - 15.9 g/dL 84.1 (L)  HCT     32.4 - 46.6 % 34.7  MCV     79 - 97 fL 76 (L)  MCH     26.6 - 33.0 pg 22.3 (L)  MCHC     31.5 - 35.7 g/dL 40.1 (L)  RDW     02.7 - 15.4 % 17.2 (H)  Platelets     150 - 450 x10E3/uL 368  Neutrophils     Not Estab. % 33  Lymphs     Not Estab. % 45  Monocytes     Not Estab. % 8  Eos     Not Estab. % 13  Basos  Not Estab. % 1   NEUT#     1.4 - 7.0 x10E3/uL 1.6  Lymphocyte #     0.7 - 3.1 x10E3/uL 2.2  Monocytes Absolute     0.1 - 0.9 x10E3/uL 0.4  EOS (ABSOLUTE)     0.0 - 0.4 x10E3/uL 0.6 (H)  Basophils Absolute     0.0 - 0.2 x10E3/uL 0.0  Immature Granulocytes     Not Estab. % 0  Immature Grans (Abs)     0.0 - 0.1 x10E3/uL 0.0  Allergen Banana IgE     Class 0 kU/L <0.10  [id number] Avocado     Class 0 kU/L <0.10    Assessment and plan: Asthma, severe persistent  - very poorly controlled at this time  - start Trelegy 1 puff daily.  This is a triple therapy maintenance controller medication.  Sample provided and pt demonstrated proper use  - continue Singulair 10mg  daily at bedtime.    - have access to albuterol inhaler 2 puffs or albuterol 1 vial via nebulizer every 4-6 hours as needed for cough/wheeze/shortness of breath/chest tightness.  May use 15-20 minutes prior to activity.   Monitor frequency of use.    - use albuterol with space device  - step-up asthma therapy also discussed today including Tezspire as add-on therapy.  Discussed benefits, risks and monthly injection protocol.  Will see if this is covered by insurance.  You will be notified by , our biologic coordinator, in regards to insurance coverage.    Asthma control goals:  Full participation in all desired activities (may need albuterol before activity) Albuterol use two time or less a week on average (not counting use with activity) Cough interfering with sleep two time or less a month Oral steroids no more than once a year No hospitalizations  Adverse food reaction  -  continue avoidance/moderation of banana and avocado in the diet.  You had negative IgE blood testing for banana and avocado  - banana and avocado can be associated with a latex allergy thus would be cautious with latex exposure.   It also could be related to pollen food allergy syndrome since you are sensitive to pollens   Allergies  - environmental  allergy panel was positive to cat, dog, grass pollen, tree pollen, weed pollen, mold.  Provided with allergen avoidance measures.   - can use over-the-counter Pataday 1 drop each eye daily as needed for itchy eyes  - continue levocetirizine 5mg  daily as needed  Follow-up in 2 months or sooner if needed  No follow-ups on file.  I appreciate the opportunity to take part in Cathalina's care. Please do not hesitate to contact me with questions.  Sincerely,   Babette Relic, MD Allergy/Immunology [facility]

## 2021-07-15 ENCOUNTER — Telehealth: Payer: Self-pay | Admitting: *Deleted

## 2021-07-15 NOTE — Telephone Encounter (Signed)
Tried to reach patient to discuss possible start of Tezspire for asthma but voicemail full. Will try again later. Patient doesn't have specialty benefits with her Ins so we may be able to obtain free drug from PAP Tezspire together

## 2021-07-15 NOTE — Telephone Encounter (Signed)
-----   Message from [facility] Larose Hires, MD sent at 07/10/2021  1:09 PM EDT ----- Can you tell me what the coverage would be for her for tezspire.  She states someone at her many ED visits that she might benefit from a biologic like test prior and states she looked it up on her insurance at that it was going to cost $58,000 so she is quite hesitant to consider a medication like this as she thinks that is going to be very costly

## 2021-07-20 ENCOUNTER — Telehealth: Payer: Self-pay | Admitting: Allergy

## 2021-07-20 NOTE — Telephone Encounter (Signed)
Patient states even with her insurance and co pay card, the prescription is $400. Patient would like another sample until she has enough money to buy the prescription and possibly see if a prior authorization could be done to get it cheaper.  Please advise.

## 2021-07-20 NOTE — Telephone Encounter (Signed)
Called patient and let her know we didn't have any 200 mcg Trellegy samples in [place] but she can come to [place] tomorrow and pick-up a sample. Patient was given the address to our [place] office.

## 2021-07-24 NOTE — Telephone Encounter (Signed)
Patient called back this morning and said she went to [facility], today, and we were not open. I called [facility] and they did not have any Trellegy samples, so I called  and spoke with [facility]. They had some and Dee was going to set two samples up front for her. I told her their lunch hours and closing. She will pick up today.

## 2021-07-25 ENCOUNTER — Other Ambulatory Visit: Payer: Self-pay | Admitting: Allergy

## 2021-07-28 NOTE — Telephone Encounter (Signed)
Called patient but she advised she was working and would need to call me back

## 2021-08-10 ENCOUNTER — Emergency Department (HOSPITAL_COMMUNITY): Admission: EM | Admit: 2021-08-10 | Discharge: 2021-08-10 | Discharge status: Against Medical Advice | Payer: Self-pay

## 2021-08-11 ENCOUNTER — Emergency Department (HOSPITAL_BASED_OUTPATIENT_CLINIC_OR_DEPARTMENT_OTHER): Payer: Self-pay

## 2021-08-11 ENCOUNTER — Encounter (HOSPITAL_BASED_OUTPATIENT_CLINIC_OR_DEPARTMENT_OTHER): Payer: Self-pay | Admitting: *Deleted

## 2021-08-11 ENCOUNTER — Emergency Department (HOSPITAL_BASED_OUTPATIENT_CLINIC_OR_DEPARTMENT_OTHER)
Admission: EM | Admit: 2021-08-11 | Discharge: 2021-08-12 | Disposition: A | Discharge status: Home / Self Care | Payer: Self-pay | Attending: Emergency Medicine | Admitting: Emergency Medicine

## 2021-08-11 ENCOUNTER — Other Ambulatory Visit: Payer: Self-pay

## 2021-08-11 DIAGNOSIS — R112 Nausea with vomiting, unspecified: Secondary | ICD-10-CM | POA: Insufficient documentation

## 2021-08-11 DIAGNOSIS — J45909 Unspecified asthma, uncomplicated: Secondary | ICD-10-CM | POA: Insufficient documentation

## 2021-08-11 DIAGNOSIS — Z7951 Long term (current) use of inhaled steroids: Secondary | ICD-10-CM | POA: Insufficient documentation

## 2021-08-11 DIAGNOSIS — R0789 Other chest pain: Secondary | ICD-10-CM | POA: Insufficient documentation

## 2021-08-11 MED ORDER — ALUM & MAG HYDROXIDE-SIMETH 200-200-20 MG/5ML PO SUSP
30.0000 mL | Freq: Once | ORAL | Status: AC
  Administered 2021-08-11: 30 mL via ORAL
  Filled 2021-08-11: qty 30

## 2021-08-11 MED ORDER — KETOROLAC TROMETHAMINE 15 MG/ML IJ SOLN
15.0000 mg | Freq: Once | INTRAMUSCULAR | Status: AC
  Administered 2021-08-11: 15 mg via INTRAMUSCULAR
  Filled 2021-08-11: qty 1

## 2021-08-11 MED ORDER — LIDOCAINE VISCOUS HCL 2 % MT SOLN
15.0000 mL | Freq: Once | OROMUCOSAL | Status: AC
  Administered 2021-08-11: 15 mL via ORAL
  Filled 2021-08-11: qty 15

## 2021-08-11 NOTE — ED Provider Notes (Signed)
[facility] EMERGENCY DEPARTMENT Provider Note   CSN: [id number] Arrival date & time: 08/11/21  2100     History Chief Complaint  Patient presents with   Chest Pain    Leah Lamb is a 25 y.o. female.  HPI     Is a 25 year old female with a history of asthma who presents with chest discomfort.  Patient reports that she ate a marijuana gummy that she feels was too high milligram.  She began to have chest pain and nausea and vomiting after eating the gummy.  She ate the gummy late at night on 8/14.  She had sharp chest pains and multiple episodes of nonbilious, nonbloody emesis.  She states that the chest pain has lingered.  It is more dull than it was.  She describes it as sharp in nature and nonradiating.  Nothing seems to make it better or worse.  No leg swelling or history of blood clots.  No fevers or cough.  Currently she rates her pain at 6 out of 10.  Past Medical History:  Diagnosis Date   Anemia    Asthma    Obesity     There are no problems to display for this patient.   Past Surgical History:  Procedure Laterality Date   OOPHORECTOMY Left      OB History   No obstetric history on file.     Family History  Problem Relation Age of Onset   Asthma Neg Hx    Allergic rhinitis Neg Hx    Eczema Neg Hx     Social History   Tobacco Use   Smoking status: Never   Smokeless tobacco: Never  Vaping Use   Vaping Use: Never used  Substance Use Topics   Alcohol use: Never   Drug use: Never    Home Medications Prior to Admission medications   Medication Sig Start Date End Date Taking? Authorizing Provider  albuterol (PROVENTIL) (2.5 MG/3ML) 0.083% nebulizer solution Take 3 mLs (2.5 mg total) by nebulization every 6 (six) hours as needed for wheezing or shortness of breath. 05/27/21   Alvira Monday, MD  albuterol (VENTOLIN HFA) 108 (90 Base) MCG/ACT inhaler Inhale 2 puffs into the lungs every 6 (six) hours as needed for wheezing or shortness  of breath. 07/10/21   Marcelyn Bruins, MD  levocetirizine (XYZAL) 5 MG tablet Take 1 tablet (5 mg total) by mouth every evening. 04/27/21 05/27/21  Liberty Handy, PA-C  levocetirizine (XYZAL) 5 MG tablet TAKE 1 TABLET(5 MG) BY MOUTH EVERY EVENING 07/27/21   Marcelyn Bruins, MD  montelukast (SINGULAIR) 10 MG tablet TAKE 1 TABLET(10 MG) BY MOUTH AT BEDTIME 07/27/21   Marcelyn Bruins, MD  TRELEGY ELLIPTA 200-62.5-25 MCG/INH AEPB Inhale 1 puff into the lungs daily. 07/10/21   Marcelyn Bruins, MD  budesonide-formoterol [facility]) 160-4.5 MCG/ACT inhaler Inhale 2 puffs into the lungs 2 (two) times daily. 01/11/20 09/17/20  Marcelyn Bruins, MD    Allergies    Amoxicillin and Motrin [ibuprofen]  Review of Systems   Review of Systems  Constitutional:  Negative for fever.  Respiratory:  Negative for shortness of breath.   Cardiovascular:  Positive for chest pain. Negative for leg swelling.  Gastrointestinal:  Positive for nausea and vomiting. Negative for abdominal pain.  All other systems reviewed and are negative.  Physical Exam Updated Vital Signs BP 112/66 (BP Location: Right Arm)   Pulse 72   Temp 98.4 F (36.9 C) (Oral)   Resp  15   Ht 1.575 m (5\' 2" )   Wt 101.6 kg   LMP 07/15/2021   SpO2 100%   BMI 40.97 kg/m   Physical Exam Vitals and nursing note reviewed.  Constitutional:      Appearance: She is well-developed. She is obese. She is not ill-appearing.  HENT:     Head: Normocephalic and atraumatic.  Eyes:     Pupils: Pupils are equal, round, and reactive to light.  Cardiovascular:     Rate and Rhythm: Normal rate and regular rhythm.     Heart sounds: Normal heart sounds.  Pulmonary:     Effort: Pulmonary effort is normal. No respiratory distress.     Breath sounds: No wheezing.  Chest:     Chest wall: Tenderness present.  Abdominal:     General: Bowel sounds are normal.     Palpations: Abdomen is soft.  Musculoskeletal:      Cervical back: Neck supple.     Right lower leg: No tenderness. No edema.     Left lower leg: No tenderness. No edema.  Skin:    General: Skin is warm and dry.  Neurological:     Mental Status: She is alert and oriented to person, place, and time.  Psychiatric:        Mood and Affect: Mood normal.    ED Results / Procedures / Treatments   Labs (all labs ordered are listed, but only abnormal results are displayed) Labs Reviewed - No data to display  EKG EKG Interpretation  Date/Time:  Tuesday August 11 2021 21:10:43 EDT Ventricular Rate:  82 PR Interval:  134 QRS Duration: 84 QT Interval:  374 QTC Calculation: 436 R Axis:   69 Text Interpretation: Normal sinus rhythm Normal ECG Confirmed by 08-06[birth year]1979 (Ross Marcus) on 08/11/2021 11:19:01 PM  Radiology DG Chest 2 View  Result Date: 08/11/2021 CLINICAL DATA:  Chest pain EXAM: CHEST - 2 VIEW COMPARISON:  06/05/2021 FINDINGS: The heart size and mediastinal contours are within normal limits. Both lungs are clear. The visualized skeletal structures are unremarkable. IMPRESSION: No active cardiopulmonary disease. Electronically Signed   By: 08/05/2021 M.D.   On: 08/11/2021 21:56    Procedures Procedures   Medications Ordered in ED Medications  alum & mag hydroxide-simeth (MAALOX/MYLANTA) 200-200-20 MG/5ML suspension 30 mL (30 mLs Oral Given 08/11/21 2334)    And  lidocaine (XYLOCAINE) 2 % viscous mouth solution 15 mL (15 mLs Oral Given 08/11/21 2334)  ketorolac (TORADOL) 15 MG/ML injection 15 mg (15 mg Intramuscular Given 08/11/21 2335)    ED Course  I have reviewed the triage vital signs and the nursing notes.  Pertinent labs & imaging results that were available during my care of the patient were reviewed by me and considered in my medical decision making (see chart for details).    MDM Rules/Calculators/A&P                           Patient presents with ongoing chest discomfort.  She reports that it started 2 days ago  after taking a marijuana edible.  She also had nausea and vomiting at that time.  She is nontoxic and vital signs are reassuring.  She has some reproducible tenderness on exam.  She could have pulled a muscle during forceful vomiting.  She also could have some gastritis.  Lower suspicion for ACS or PE.  EKG is completely normal without evidence of ischemia or arrhythmia.  Chest x-ray  without pneumothorax or pneumonia.  Patient was given Toradol and a GI cocktail.  Recommend supportive measures at home and avoiding edibles in the future.  After history, exam, and medical workup I feel the patient has been appropriately medically screened and is safe for discharge home. Pertinent diagnoses were discussed with the patient. Patient was given return precautions.  Final Clinical Impression(s) / ED Diagnoses Final diagnoses:  Atypical chest pain    Rx / DC Orders ED Discharge Orders     None        Avyana Puffenbarger, Mayer Masker, MD 08/12/21 0010

## 2021-08-11 NOTE — ED Notes (Signed)
ED Provider at bedside. 

## 2021-08-11 NOTE — Telephone Encounter (Signed)
Patient never returned call after last contact.  If she is interested in future she can reach back out to me

## 2021-08-11 NOTE — ED Triage Notes (Signed)
C/o of chest pain x 2 days after eating marijuana edibles x 2 days ago

## 2021-08-12 NOTE — Discharge Instructions (Addendum)
You were seen today for chest pain.  This was associated with eating a marijuana edible.  Given your symptoms, would avoid edibles in the future.  Symptoms may be related to gastritis or musculoskeletal.  Follow-up with your primary physician.

## 2021-08-20 ENCOUNTER — Telehealth: Payer: Self-pay

## 2021-08-20 NOTE — Telephone Encounter (Signed)
Patient called stating her Trelegy 200 is running around $360 a month. Patient states the medication really works but is not affordable. She is wondering what would be the next steps.   Patient is requesting a sample of Trelegy 200 as she only has 5 puffs left.

## 2021-08-21 NOTE — Telephone Encounter (Signed)
Patient picked up sample today (08/21/21) at 11:15am.

## 2021-09-02 ENCOUNTER — Other Ambulatory Visit: Payer: Self-pay | Admitting: Allergy

## 2021-09-07 ENCOUNTER — Telehealth: Payer: Self-pay | Admitting: Family Medicine

## 2021-09-07 NOTE — Telephone Encounter (Signed)
Patient is on Trelegy. She said she has gone through co pay assistance and tried a co pay card and it is still $300. She was told that a prior authorization would not be possible for this. She said she is almost out. Is there something else she could try.

## 2021-09-08 NOTE — Telephone Encounter (Signed)
Dr. Delorse Lek is there anything else Leah Lamb can try besides Qvar, because it isn't covered by the insurance and co-pay card is still $300, prior authorization isn't possible for this.

## 2021-09-08 NOTE — Telephone Encounter (Signed)
Spoke to patient and informed her of the different inhalers and she stated she will call her insurance to find out the cost. She also state she would prefer not to take injections she would rather stick with an inhaler, I still gave her Tammy's number and she did say she will reach out to her.

## 2021-09-10 NOTE — Patient Instructions (Signed)
Asthma Continue montelukast 10 mg once a day to prevent cough or wheeze Begin Dulera 200-2 puffs twice a day with a spacer to prevent cough or wheeze Begin Spiriva 1.25- 2 puffs once a day to prevent cough and wheeze The combination of Dulera and Spiriva will replace Trelegy Continue albuterol 2 puffs every 4 hours as needed for cough or wheeze OR Instead use albuterol 0.083% solution via nebulizer one unit vial every 4 hours as needed for cough or wheeze Begin Tezspire injections once every 28 days to control asthma  Allergic rhinitis Continue allergen avoidance measures directed toward grass pollen, tree pollen, weed pollen, mold, cat, and dog as listed below. Continue Xyzal 5 mg once a day as needed for a runny nose or itch Continue Flonase 2 sprays in each nostril once a day as needed for a stuffy nose.  In the right nostril, point the applicator out toward the right ear. In the left nostril, point the applicator out toward the left ear Consider saline nasal rinses as needed for nasal symptoms. Use this before any medicated nasal sprays for best result  Allergic conjunctivitis Some over the counter eye drops include Pataday one drop in each eye once a day as needed for red, itchy eyes OR Zaditor one drop in each eye twice a day as needed for red itchy eyes.  Food Continue to avoid banana and avocado as these irritate your mouth.    Call the clinic if this treatment plan is not working well for you  Follow up in 2 months or sooner if needed.  Reducing Pollen Exposure The American Academy of Allergy, Asthma and Immunology suggests the following steps to reduce your exposure to pollen during allergy seasons. Do not hang sheets or clothing out to dry; pollen may collect on these items. Do not mow lawns or spend time around freshly cut grass; mowing stirs up pollen. Keep windows closed at night.  Keep car windows closed while driving. Minimize morning activities outdoors, a time when  pollen counts are usually at their highest. Stay indoors as much as possible when pollen counts or humidity is high and on windy days when pollen tends to remain in the air longer. Use air conditioning when possible.  Many air conditioners have filters that trap the pollen spores. Use a HEPA room air filter to remove pollen form the indoor air you breathe.  Control of Mold Allergen Mold and fungi can grow on a variety of surfaces provided certain temperature and moisture conditions exist.  Outdoor molds grow on plants, decaying vegetation and soil.  The major outdoor mold, Alternaria and Cladosporium, are found in very high numbers during hot and dry conditions.  Generally, a late Summer - Fall peak is seen for common outdoor fungal spores.  Rain will temporarily lower outdoor mold spore count, but counts rise rapidly when the rainy period ends.  The most important indoor molds are Aspergillus and Penicillium.  Dark, humid and poorly ventilated basements are ideal sites for mold growth.  The next most common sites of mold growth are the bathroom and the kitchen.  Outdoor [employer] Use air conditioning and keep windows closed Avoid exposure to decaying vegetation. Avoid leaf raking. Avoid grain handling. Consider wearing a face mask if working in moldy areas.  Indoor Mold Control Maintain humidity below 50%. Clean washable surfaces with 5% bleach solution. Remove sources e.g. Contaminated carpets.  Control of Dog or Cat Allergen Avoidance is the best way to manage a dog or  cat allergy. If you have a dog or cat and are allergic to dog or cats, consider removing the dog or cat from the home. If you have a dog or cat but don't want to find it a new home, or if your family wants a pet even though someone in the household is allergic, here are some strategies that may help keep symptoms at bay:  Keep the pet out of your bedroom and restrict it to only a few rooms. Be advised that keeping the dog  or cat in only one room will not limit the allergens to that room. Don't pet, hug or kiss the dog or cat; if you do, wash your hands with soap and water. High-efficiency particulate air (HEPA) cleaners run continuously in a bedroom or living room can reduce allergen levels over time. Regular use of a high-efficiency vacuum cleaner or a central vacuum can reduce allergen levels. Giving your dog or cat a bath at least once a week can reduce airborne allergen.

## 2021-09-10 NOTE — Progress Notes (Addendum)
[address] [place] [address] Dept: [phone]  FOLLOW UP NOTE  Patient ID: Leah Lamb, female    DOB: April 16, 1996  Age: 25 y.o. MRN: [id number] Date of Office Visit: 09/11/2021  Assessment  Chief Complaint: Follow-up  HPI Leah Lamb is a 25 year old female who presents clinic for follow-up visit.  She was last seen in this clinic by Dr. 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  At today's visit, she reports her asthma has been much more well controlled with shortness of breath occurring with moderate to vigorous activity.  She denies shortness of breath, cough, wheezing at rest or with moderate activity.  She continues montelukast 10 mg once a day and uses albuterol before activity and 3 to 4 days a week for rescue.  She is currently using samples of Trelegy 200 that she received from our office, however, her insurance will not cover this medication and even after the co-pay card is applied she reports it is over $300 a month.  She reports that she has been using Trelegy every other day for the last 2 weeks due to running low on her supply.  She has not received any biologic medications to control her asthma at this time.  Allergic rhinitis is reported as moderately well controlled with symptoms including nasal congestion and sneeze for which she continues Xyzal 5 mg once a day and is not currently using Flonase or nasal saline rinses.  Allergic conjunctivitis is reported as well controlled with no medical intervention at this time.  She continues to avoid banana and avocado due to mouth tingling.  She reports that she does not have any adverse reaction with the latex.  Her current medications are listed in the chart.   Drug Allergies:  Allergies  Allergen Reactions   Amoxicillin Hives   Motrin [Ibuprofen] Itching    Physical Exam: BP 110/72   Pulse 92   Temp 98.2 F (36.8  C) (Temporal)   Resp 18   Ht 5\' 2"  (1.575 m)   Wt 223 lb 8 oz (101.4 kg)   SpO2 97%   BMI 40.88 kg/m    Physical Exam Vitals reviewed.  Constitutional:      Appearance: Normal appearance.  HENT:     Head: Normocephalic and atraumatic.     Right Ear: Tympanic membrane normal.     Left Ear: Tympanic membrane normal.     Nose:     Comments: Bilateral nares edematous and pale with clear nasal drainage noted.  Pharynx normal.  Ears normal.  Eyes normal.    Mouth/Throat:     Pharynx: Oropharynx is clear.  Eyes:     Conjunctiva/sclera: Conjunctivae normal.  Cardiovascular:     Rate and Rhythm: Normal rate and regular rhythm.     Heart sounds: Normal heart sounds. No murmur heard. Pulmonary:     Effort: Pulmonary effort is normal.     Breath sounds: Normal breath sounds.     Comments: Lungs clear to auscultation Musculoskeletal:        General: Normal range of motion.     Cervical back: Normal range of motion and neck supple.  Skin:    General: Skin is warm and dry.  Neurological:     Mental Status: She is alert and oriented to person, place, and time.  Psychiatric:        Mood and Affect: Mood normal.  Behavior: Behavior normal.        Thought Content: Thought content normal.        Judgment: Judgment normal.    Diagnostics: FVC 2.08, FEV1 1.60.  Predicted FVC 3.03, predicted FEV1 2.65.  Spirometry indicates mild restriction.    Assessment and Plan: 1. Not well controlled severe persistent asthma   2. Seasonal and perennial allergic rhinitis   3. Allergic conjunctivitis of both eyes   4. Adverse food reaction, subsequent encounter   5. Microcytic anemia     Meds ordered this encounter  Medications   Tiotropium Bromide Monohydrate (SPIRIVA RESPIMAT) 1.25 MCG/ACT AERS    Sig: Inhale 2 puffs into the lungs daily.    Dispense:  1 each    Refill:  5   mometasone-formoterol (DULERA) 200-5 MCG/ACT AERO    Sig: Inhale 2 puffs into the lungs 2 (two) times daily.     Dispense:  1 each    Refill:  5     Patient Instructions  Asthma Continue montelukast 10 mg once a day to prevent cough or wheeze Begin Dulera 200-2 puffs twice a day with a spacer to prevent cough or wheeze Begin Spiriva 1.25- 2 puffs once a day to prevent cough and wheeze The combination of Dulera and Spiriva will replace Trelegy Continue albuterol 2 puffs every 4 hours as needed for cough or wheeze OR Instead use albuterol 0.083% solution via nebulizer one unit vial every 4 hours as needed for cough or wheeze Begin Tezspire injections once every 28 days to control asthma  Allergic rhinitis Continue allergen avoidance measures directed toward grass pollen, tree pollen, weed pollen, mold, cat, and dog as listed below. Continue Xyzal 5 mg once a day as needed for a runny nose or itch Continue Flonase 2 sprays in each nostril once a day as needed for a stuffy nose.  In the right nostril, point the applicator out toward the right ear. In the left nostril, point the applicator out toward the left ear Consider saline nasal rinses as needed for nasal symptoms. Use this before any medicated nasal sprays for best result  Allergic conjunctivitis Some over the counter eye drops include Pataday one drop in each eye once a day as needed for red, itchy eyes OR Zaditor one drop in each eye twice a day as needed for red itchy eyes.  Food allergy Continue to avoid banana and avocado as these irritate your mouth.    Call the clinic if this treatment plan is not working well for you  Follow up in 2 months or sooner if needed.  Thank you for the opportunity to care for this patient.  Please do not hesitate to contact me with questions.  Thermon Leyland, FNP [facility]  -------------------------------------------------------- Attestation:  I reviewed the Nurse Practitioner's note and agree with the documented findings and plan of care. We discussed the patient and  developed a plan concurrently.  Agree with starting her on biologic asthma medication.  She has been recommended to start Tezspire for improved asthma control however our Biologics coordinator has not been able to reach her to initiate this process prior to this current visit.    Margo Aye, MD [facility]

## 2021-09-11 ENCOUNTER — Encounter: Payer: Self-pay | Admitting: Family Medicine

## 2021-09-11 ENCOUNTER — Ambulatory Visit (INDEPENDENT_AMBULATORY_CARE_PROVIDER_SITE_OTHER): Payer: No Typology Code available for payment source | Admitting: Family Medicine

## 2021-09-11 ENCOUNTER — Other Ambulatory Visit: Payer: Self-pay

## 2021-09-11 VITALS — BP 110/72 | HR 92 | Temp 98.2°F | Resp 18 | Ht 62.0 in | Wt 223.5 lb

## 2021-09-11 DIAGNOSIS — J302 Other seasonal allergic rhinitis: Secondary | ICD-10-CM

## 2021-09-11 DIAGNOSIS — T781XXA Other adverse food reactions, not elsewhere classified, initial encounter: Secondary | ICD-10-CM | POA: Insufficient documentation

## 2021-09-11 DIAGNOSIS — D509 Iron deficiency anemia, unspecified: Secondary | ICD-10-CM | POA: Diagnosis not present

## 2021-09-11 DIAGNOSIS — J3089 Other allergic rhinitis: Secondary | ICD-10-CM | POA: Diagnosis not present

## 2021-09-11 DIAGNOSIS — J4550 Severe persistent asthma, uncomplicated: Secondary | ICD-10-CM

## 2021-09-11 DIAGNOSIS — T781XXD Other adverse food reactions, not elsewhere classified, subsequent encounter: Secondary | ICD-10-CM

## 2021-09-11 DIAGNOSIS — H1013 Acute atopic conjunctivitis, bilateral: Secondary | ICD-10-CM | POA: Diagnosis not present

## 2021-09-11 MED ORDER — SPIRIVA RESPIMAT 1.25 MCG/ACT IN AERS: 2.0000 | INHALATION_SPRAY | Freq: Every day | RESPIRATORY_TRACT | 5 refills | Status: AC

## 2021-09-11 MED ORDER — DULERA 200-5 MCG/ACT IN AERO: 2.0000 | INHALATION_SPRAY | Freq: Two times a day (BID) | RESPIRATORY_TRACT | 5 refills | Status: AC

## 2021-09-15 ENCOUNTER — Telehealth: Payer: Self-pay

## 2021-09-15 NOTE — Telephone Encounter (Signed)
Thank you :)

## 2021-09-15 NOTE — Telephone Encounter (Signed)
Patient called to see if she can get another sample of Trelegy as she thought she threw out the used up Trelegy but she accidentally threw away the sample we just gave her.   She also hasn't picked up the two inhalers due to her not being able to pay for it this week. She states she may be able to pick it up next week.   I have placed a sample up front for the patient to pick up.

## 2021-10-05 ENCOUNTER — Telehealth: Payer: Self-pay | Admitting: Family Medicine

## 2021-10-05 NOTE — Telephone Encounter (Signed)
Pt called requesting another sample of Trelegy. Patient states she was unable to pick up her prescription from the pharmacy due to starting a new job & not getting paid when she thought she would. Patient stated she has had to use her inhaler more often lately due to flare ups.   Best contact number: [phone]

## 2021-10-06 NOTE — Telephone Encounter (Signed)
Pt informed of the sample at front desk in [place] that dyasia has left for her she will come pick it up this evening

## 2021-10-06 NOTE — Telephone Encounter (Signed)
Please advise on trelegy sample.

## 2021-10-06 NOTE — Telephone Encounter (Signed)
Can you please give her another Trelegy sample. Thank you

## 2021-10-15 NOTE — Telephone Encounter (Signed)
Called patient to follow up on trelegy sample. I left a message for her to call the office back.

## 2021-10-19 ENCOUNTER — Telehealth: Payer: Self-pay

## 2021-10-19 NOTE — Telephone Encounter (Signed)
I called to patient Friaday to follow up on the trelegy sample note and to see if she picked them up from the [place] office. The patient called back today 10/19/21 about the Trelegy 200 samples she has not picked them up yet. Patient plans to pick them up tomorrow from the [facility] [place] office 1025/22. I placed 2 samples at the front office for patient pickup.

## 2021-10-21 NOTE — Telephone Encounter (Signed)
Thank you :)

## 2021-11-26 NOTE — Patient Instructions (Addendum)
Asthma Continue montelukast 10 mg once a day to prevent cough or wheeze Continue Trelegy (we only have samples of inhaler device today) 1 puff daily Continue albuterol 2 puffs every 4 hours as needed for cough or wheeze OR Instead use albuterol 0.083% solution via nebulizer one unit vial every 4 hours as needed for cough or wheeze Still recommend Tezspire injections once every 28 days to control asthma for you Let us know as soon as you get your new insurance so we can see if your medications are better covered and to resubmit for Tezspire approval  Allergic rhinitis Continue allergen avoidance measures directed toward grass pollen, tree pollen, weed pollen, mold, cat, and dog as listed below. Stop Xyzal (levocetirizine) and Try Allegra 180mg  1 tab daily (samples provided) for general allergy symptom control Use Nasacort or Flonase 2 sprays in each nostril once a day as needed for a stuffy nose.  In the right nostril, point the applicator out toward the right ear. In the left nostril, point the applicator out toward the left ear Consider saline nasal rinses as needed for nasal symptoms. Use this before any medicated nasal sprays for best result  Allergic conjunctivitis Some over the counter eye drops include Pataday one drop in each eye once a day as needed for red, itchy eyes OR Zaditor one drop in each eye twice a day as needed for red itchy eyes.  Food Continue to avoid banana and avocado as these irritate your mouth.    Follow up in 3 months or sooner if needed.

## 2021-11-27 ENCOUNTER — Encounter: Payer: Self-pay | Admitting: Allergy

## 2021-11-27 ENCOUNTER — Other Ambulatory Visit: Payer: Self-pay

## 2021-11-27 ENCOUNTER — Ambulatory Visit (INDEPENDENT_AMBULATORY_CARE_PROVIDER_SITE_OTHER): Payer: No Typology Code available for payment source | Admitting: Allergy

## 2021-11-27 VITALS — BP 104/62 | HR 80 | Temp 97.6°F | Resp 16 | Ht 61.5 in | Wt 224.8 lb

## 2021-11-27 DIAGNOSIS — J3089 Other allergic rhinitis: Secondary | ICD-10-CM

## 2021-11-27 DIAGNOSIS — H1013 Acute atopic conjunctivitis, bilateral: Secondary | ICD-10-CM | POA: Diagnosis not present

## 2021-11-27 DIAGNOSIS — J302 Other seasonal allergic rhinitis: Secondary | ICD-10-CM

## 2021-11-27 DIAGNOSIS — J4550 Severe persistent asthma, uncomplicated: Secondary | ICD-10-CM | POA: Diagnosis not present

## 2021-11-27 DIAGNOSIS — T781XXD Other adverse food reactions, not elsewhere classified, subsequent encounter: Secondary | ICD-10-CM

## 2021-11-27 NOTE — Progress Notes (Signed)
Follow-up Note  RE: Leah Lamb MRN: [id number] DOB: October 06, 1996 Date of Office Visit: 11/27/2021   History of present illness: Leah Lamb is a 25 y.o. female presenting today for follow-up of severe persistent asthma, allergic rhinoconjunctivitis and food allergy.  She was last seen in the office on 09/11/2021 by our nurse practitioner Ambs.  She states she feels that her asthma has been doing better.  She is having less symptoms of cough, wheeze, shortness of breath.  She unfortunately she has an Financial controller that does not seem to cover most medications.  She states that the inhalers with her current insurance plan the cheapest will be $200-$300.  She is not able to afford that.  She actually states that this plan does not cover any's "specialty" prescription medicines.  Thus she does not have the [facility] or Spiriva that was recommended at the last visit as these are not affordable.  Therefore she has continued to use the Trelegy sample but she is having to spread out her doses to stretch it.  She states right now she is using it every other day.  She does continue to take Singulair.  She has not started Tezspire but states she did talk to Tammy our Musician.  She states that she sent her paperwork to fill out but she lost this paperwork.  She is looking for a new insurance plan for the new year with better medication coverage.  With her allergies she states she is having more nasal congestion and drainage as well as sneezing.  Symptoms are pretty much daily.  She has taking levocetirizine and does not feel like it is working.  She does have Flonase but does not take this every day when she has symptoms.  She is not having any eye symptoms thus has not needed to use any eyedrops.  She does continue to avoid banana and avocado in the diet.  Review of systems: Review of Systems  Constitutional: Negative.   HENT:  Positive for congestion, postnasal drip and sneezing.    Eyes: Negative.   Respiratory: Negative.    Cardiovascular: Negative.   Gastrointestinal: Negative.   Musculoskeletal: Negative.   Allergic/Immunologic: Negative.   Neurological: Negative.     All other systems negative unless noted above in HPI  Past medical/social/surgical/family history have been reviewed and are unchanged unless specifically indicated below.  No changes  Medication List: Current Outpatient Medications  Medication Sig Dispense Refill   albuterol (PROVENTIL) (2.5 MG/3ML) 0.083% nebulizer solution Take 3 mLs (2.5 mg total) by nebulization every 6 (six) hours as needed for wheezing or shortness of breath. 75 mL 12   albuterol (VENTOLIN HFA) 108 (90 Base) MCG/ACT inhaler INHALE 2 PUFFS INTO THE LUNGS EVERY 6 HOURS AS NEEDED FOR WHEEZING OR SHORTNESS OF BREATH 18 g 0   levocetirizine (XYZAL) 5 MG tablet TAKE 1 TABLET(5 MG) BY MOUTH EVERY EVENING 30 tablet 4   montelukast (SINGULAIR) 10 MG tablet TAKE 1 TABLET(10 MG) BY MOUTH AT BEDTIME 30 tablet 4   TRELEGY ELLIPTA 200-62.5-25 MCG/INH AEPB Inhale 1 puff into the lungs daily. 28 each 5   mometasone-formoterol (DULERA) 200-5 MCG/ACT AERO Inhale 2 puffs into the lungs 2 (two) times daily. (Patient not taking: Reported on 11/27/2021) 1 each 5   Tiotropium Bromide Monohydrate (SPIRIVA RESPIMAT) 1.25 MCG/ACT AERS Inhale 2 puffs into the lungs daily. (Patient not taking: Reported on 11/27/2021) 1 each 5   No current facility-administered medications for this visit.  Known medication allergies: Allergies  Allergen Reactions   Amoxicillin Hives   Motrin [Ibuprofen] Itching     Physical examination: Blood pressure 104/62, pulse 80, temperature 97.6 F (36.4 C), temperature source Temporal, resp. rate 16, height 5' 1.5" (1.562 m), weight 224 lb 12.8 oz (102 kg), SpO2 98 %.  General: Alert, interactive, in no acute distress. HEENT: PERRLA, TMs pearly gray, turbinates moderately edematous without discharge, post-pharynx  non erythematous. Neck: Supple without lymphadenopathy. Lungs: Clear to auscultation without wheezing, rhonchi or rales. {no increased work of breathing. CV: Normal S1, S2 without murmurs. Abdomen: Nondistended, nontender. Skin: Warm and dry, without lesions or rashes. Extremities:  No clubbing, cyanosis or edema. Neuro:   Grossly intact.  Diagnositics/Labs:  Spirometry: FEV1: 1.81 L 71%, FVC: 2.12 L 72% predicted.  This is an improved study from her previous study.  Assessment and plan:   Asthma Continue montelukast 10 mg once a day to prevent cough or wheeze Continue Trelegy (we only have samples of inhaler device today) 1 puff daily Continue albuterol 2 puffs every 4 hours as needed for cough or wheeze OR Instead use albuterol 0.083% solution via nebulizer one unit vial every 4 hours as needed for cough or wheeze Still recommend Tezspire injections once every 28 days to control asthma for you Let us know as soon as you get your new insurance so we can see if your medications are better covered and to resubmit for Tezspire approval  Allergic rhinitis Continue allergen avoidance measures directed toward grass pollen, tree pollen, weed pollen, mold, cat, and dog as listed below. Stop Xyzal (levocetirizine) and Try Allegra 180mg  1 tab daily (samples provided) for general allergy symptom control Use Nasacort or Flonase 2 sprays in each nostril once a day as needed for a stuffy nose.  In the right nostril, point the applicator out toward the right ear. In the left nostril, point the applicator out toward the left ear Consider saline nasal rinses as needed for nasal symptoms. Use this before any medicated nasal sprays for best result  Allergic conjunctivitis Some over the counter eye drops include Pataday one drop in each eye once a day as needed for red, itchy eyes OR Zaditor one drop in each eye twice a day as needed for red itchy eyes.  Food Continue to avoid banana and avocado  as these irritate your mouth.    Follow up in 3 months or sooner if needed.  I appreciate the opportunity to take part in Modestine's care. Please do not hesitate to contact me with questions.  Sincerely,   , MD Allergy/Immunology [facility]

## 2021-12-01 NOTE — Telephone Encounter (Signed)
Patient called to see if she can get enough Trelegy 100  Samples to last till February. She will be leaving out of town on Friday and want be back till February some time.   Please Advise

## 2021-12-23 ENCOUNTER — Telehealth: Payer: Self-pay | Admitting: Family Medicine

## 2021-12-23 NOTE — Telephone Encounter (Signed)
Patient called to get Trelegy 200 mg samples. I put 2 up front for patient to pick up 12/24/2021 or 12/28/2021.

## 2022-02-24 IMAGING — DX DG CHEST 1V PORT
1 series · 1 of 1 positions shown · non-contrast
Comparison: 11/04/2020

CLINICAL DATA: Shortness of breath, asthma

EXAM:
PORTABLE CHEST 1 VIEW

[chest ap]
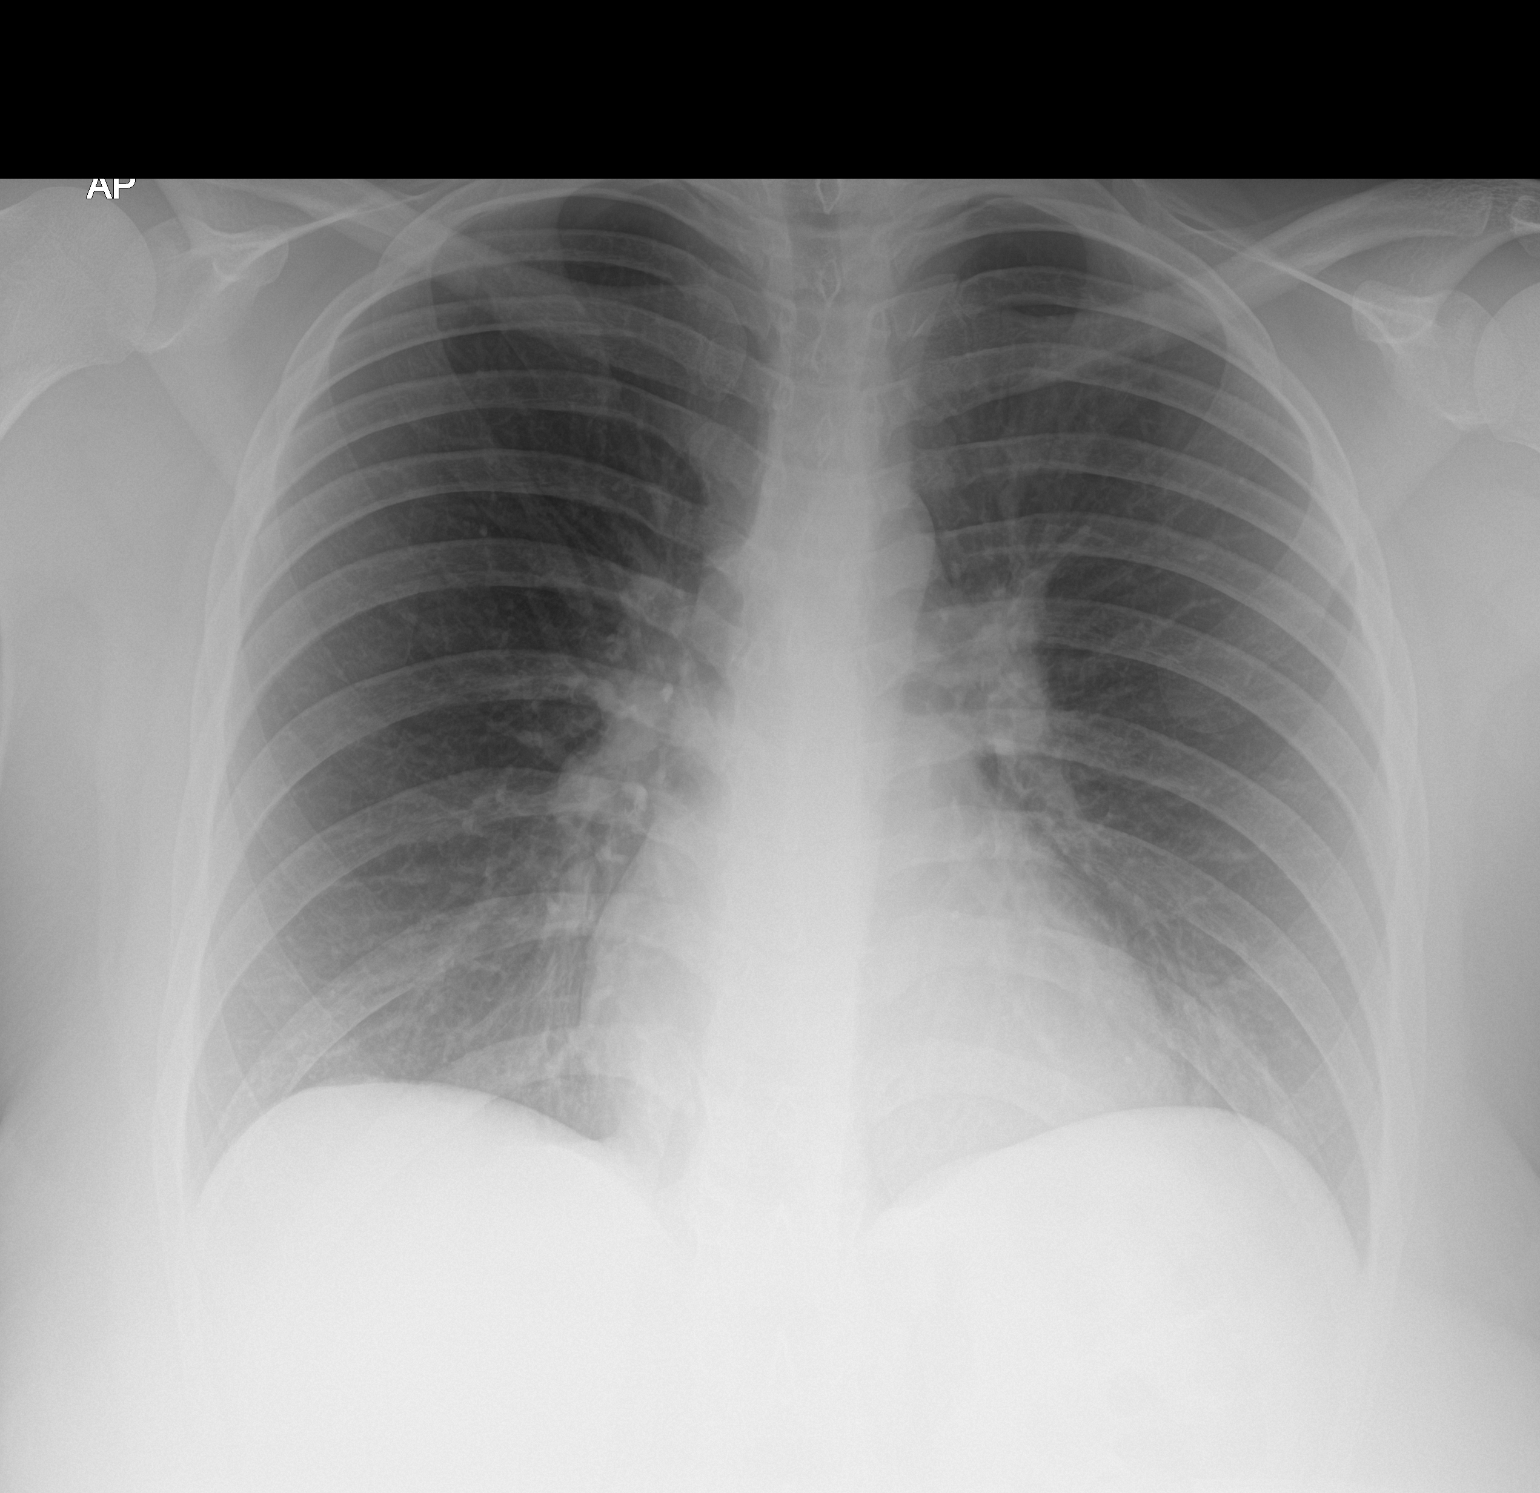

[1 of 1 positions shown; findings below may reference images not displayed]

FINDINGS: The heart size and mediastinal contours are within normal limits.
Mildly increased bibasilar interstitial markings. No lobar
consolidation. No pleural effusion or pneumothorax. The visualized
skeletal structures are unremarkable.
IMPRESSION: Mildly increased bibasilar interstitial markings, which may
represent bronchitic type lung changes. Developing atypical/viral
infection not excluded.

## 2022-03-03 ENCOUNTER — Ambulatory Visit: Payer: No Typology Code available for payment source | Admitting: Allergy

## 2022-03-05 IMAGING — DX DG CHEST 1V PORT
1 series · 1 of 1 positions shown · non-contrast
Comparison: May 27, 2021

CLINICAL DATA: Shortness of breath

EXAM:
PORTABLE CHEST 1 VIEW

[chest ap]
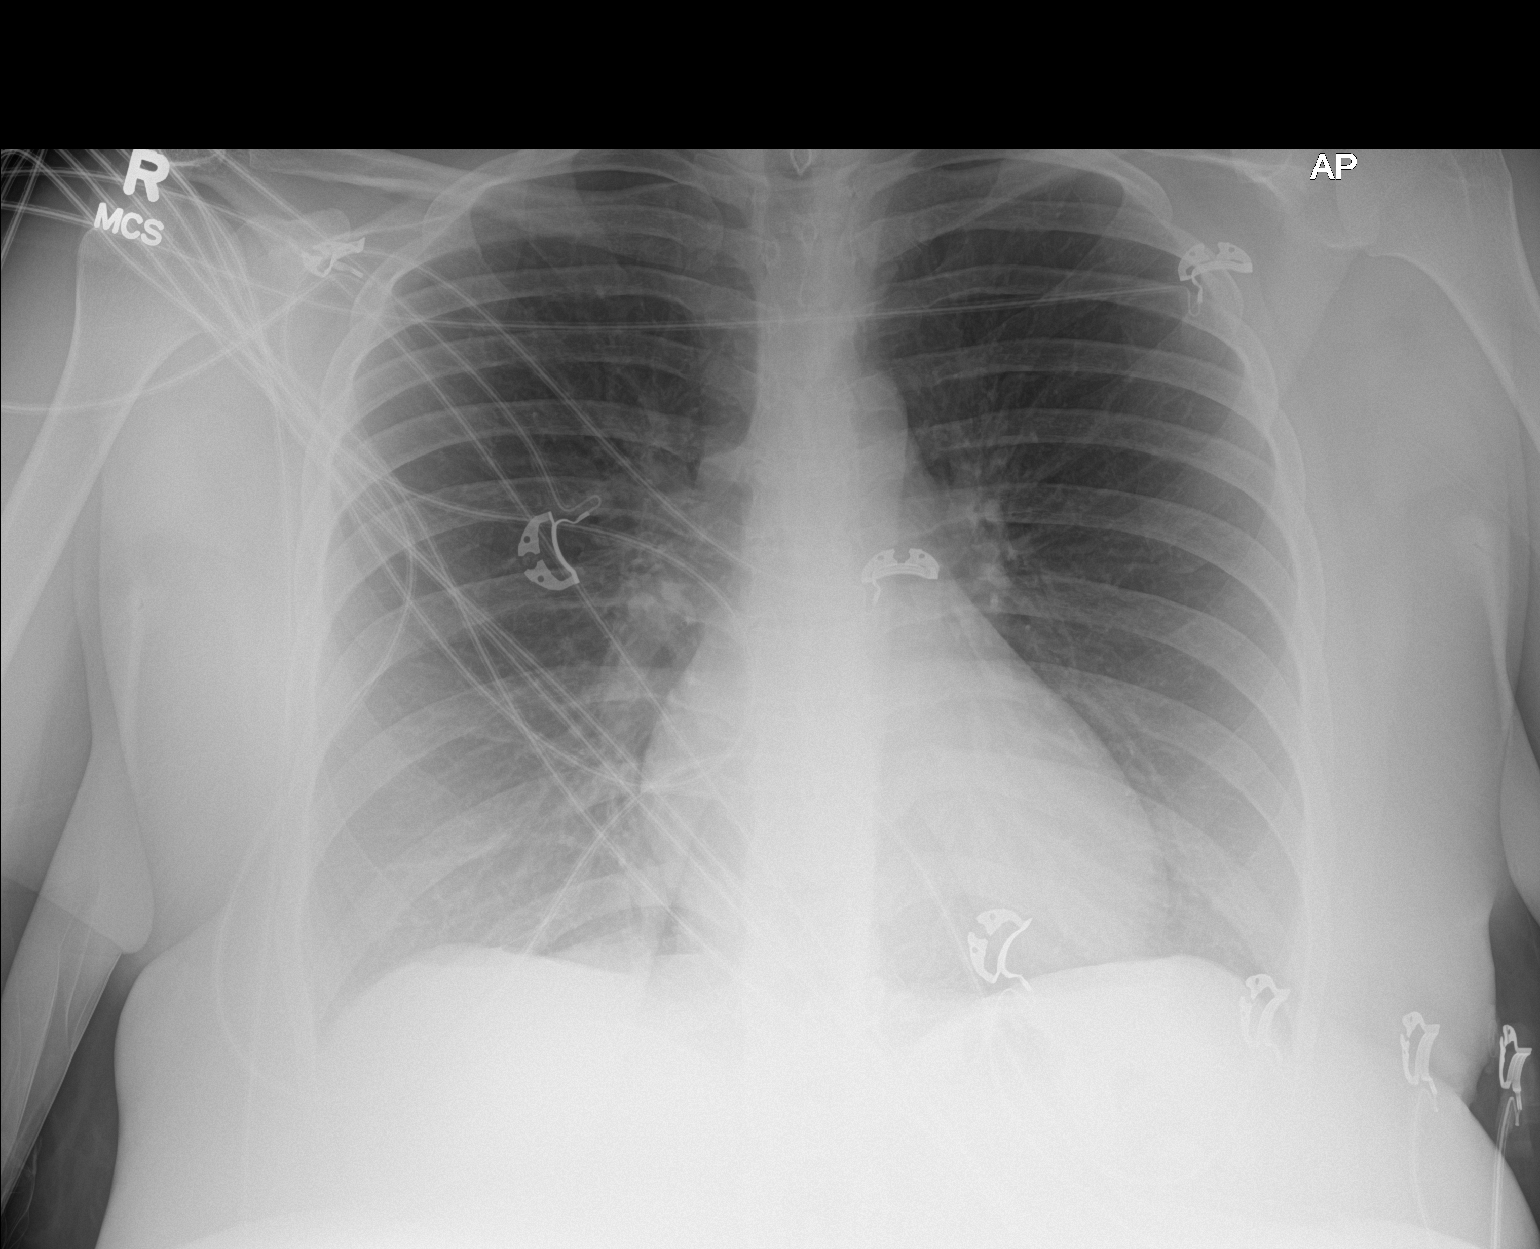

[1 of 1 positions shown; findings below may reference images not displayed]

FINDINGS: Lungs are clear. Heart size and pulmonary vascularity are normal. No
adenopathy. No bone lesions.
IMPRESSION: Lungs clear.  Cardiac silhouette normal.

## 2022-03-16 NOTE — Progress Notes (Deleted)
? ?FOLLOW UP ?Date of Service/Encounter:  03/16/22 ? ? ?Subjective:  ?Leah Lamb (DOB: May 06, 1996) is a 26 y.o. female [facility] in re-evaluation of the following: severe persistent asthma, allergic rhinoconjunctivitis and food allergy ?History obtained from: chart review and {Persons; PED relatives w/patient:19415::"patient"}. ? ?For Review, LV was on 11/27/21  with Dr. Delorse Lek. ?She reported poor insurance voerage on her inhalers so using Trelegy sample spaced out.  Has discussed Trelegy with Tammy [employer] forces logistics/support/administrative officer) but waiting for new insurance.  FEV1 71% at last visit ?Avoids banana and avocado (mouth irritation). ? ?ED visit on 03/15/21 for asthma flare; CXR findings "No effusion or consolidation is identified. A degree of mild peribronchial thickening centrally extending to the medial right and left infrahilar region may be present and there is subtle atelectatic change at the lingula. No vascular congestion or edema. " ?ED visit on 02/15/22 for asthma flare ? ?Pertinent History/Diagnostics:  ?- Asthma: using samples as controller inhaler due to  poor insurance coverage ?- Allergic Rhinitis:  ? - previous testing + grass pollen, tree pollen, weed pollen, mold, cat, and dog  ?- Food Allergy/PFAS (avocado, banana) ? - Hx of reaction: mouth irritation ? - SPT select foods (***): *** ? ? ?Allergies as of 03/17/2022   ? ?   Reactions  ? Amoxicillin Hives  ? Motrin [ibuprofen] Itching  ? ?  ? ?  ?Medication List  ?  ? ?  ? Accurate as of March 16, 2022  1:44 PM. If you have any questions, ask your nurse or doctor.  ?  ?  ? ?  ? ?albuterol (2.5 MG/3ML) 0.083% nebulizer solution ?Commonly known as: PROVENTIL ?Take 3 mLs (2.5 mg total) by nebulization every 6 (six) hours as needed for wheezing or shortness of breath. ?  ?albuterol 108 (90 Base) MCG/ACT inhaler ?Commonly known as: VENTOLIN HFA ?INHALE 2 PUFFS INTO THE LUNGS EVERY 6 HOURS AS NEEDED FOR  WHEEZING OR SHORTNESS OF BREATH ?  ?Dulera 200-5 MCG/ACT Aero ?Generic drug: mometasone-formoterol ?Inhale 2 puffs into the lungs 2 (two) times daily. ?  ?levocetirizine 5 MG tablet ?Commonly known as: XYZAL ?TAKE 1 TABLET(5 MG) BY MOUTH EVERY EVENING ?  ?montelukast 10 MG tablet ?Commonly known as: SINGULAIR ?TAKE 1 TABLET(10 MG) BY MOUTH AT BEDTIME ?  ?Spiriva Respimat 1.25 MCG/ACT Aers ?Generic drug: Tiotropium Bromide Monohydrate ?Inhale 2 puffs into the lungs daily. ?  ?Trelegy Ellipta 200-62.5-25 MCG/ACT Aepb ?Generic drug: Fluticasone-Umeclidin-Vilant ?Inhale 1 puff into the lungs daily. ?  ? ?  ? ?Past Medical History:  ?Diagnosis Date  ? Anemia   ? Asthma   ? Obesity   ? ?Past Surgical History:  ?Procedure Laterality Date  ? OOPHORECTOMY Left   ? ?Otherwise, there have been no changes to her past medical history, surgical history, family history, or social history. ? ?ROS: All others negative except as noted per HPI.  ? ?Objective:  ?There were no vitals taken for this visit. ?There is no height or weight on file to calculate BMI. ?Physical Exam: ?General Appearance:  Alert, cooperative, no distress, appears stated age  ?Head:  Normocephalic, without obvious abnormality, atraumatic  ?Eyes:  Conjunctiva clear, EOM's intact  ?Nose: Nares normal, {Blank multiple:19196:a:"***","hypertrophic turbinates","normal mucosa","no visible anterior polyps","septum midline"}  ?Throat: Lips, tongue normal; teeth and gums normal, {Blank multiple:19196:a:"***","normal posterior oropharynx","tonsils 2+","tonsils 3+","no tonsillar exudate","+ cobblestoning"}  ?Neck: Supple, symmetrical  ?Lungs:   {Blank multiple:19196:a:"***","clear to auscultation bilaterally","end-expiratory wheezing","wheezing throughout"}, Respirations  unlabored, {Blank multiple:19196:a:"***","no coughing","intermittent dry coughing"}  ?Heart:  {Blank multiple:19196:a:"***","regular rate and rhythm","no murmur"}, Appears well perfused  ?Extremities: No  edema  ?Skin: Skin color, texture, turgor normal, no rashes or lesions on visualized portions of skin  ?Neurologic: No gross deficits  ? ?Reviewed: *** ? ?Spirometry:  ?Tracings reviewed. Her effort: {Blank single:19197::"Good reproducible efforts.","It was hard to get consistent efforts and there is a question as to whether this reflects a maximal maneuver.","Poor effort, data can not be interpreted.","Variable effort-results affected.","decent for first attempt at spirometry."} ?FVC: ***L ?FEV1: ***L, ***% predicted ?FEV1/FVC ratio: ***% ?Interpretation: {Blank single:19197::"Spirometry consistent with mild obstructive disease","Spirometry consistent with moderate obstructive disease","Spirometry consistent with severe obstructive disease","Spirometry consistent with possible restrictive disease","Spirometry consistent with mixed obstructive and restrictive disease","Spirometry uninterpretable due to technique","Spirometry consistent with normal pattern","No overt abnormalities noted given today's efforts"}.  ?Please see scanned spirometry results for details. ? ?Skin Testing: {Blank single:19197::"Select foods","Environmental allergy panel","Environmental allergy panel and select foods","Food allergy panel","None","Deferred due to recent antihistamines use"}. ?Positive test to: ***. Negative test to: ***.  ?Results discussed with patient/family. ? ? ?{Blank single:19197::"Allergy testing results were read and interpreted by myself, documented by clinical staff."," "} ? ?Assessment/Plan  ? ?*** ? ?Tonny Bollman, MD  ?[facility] ? ? ? ? ? ? ?

## 2022-03-17 ENCOUNTER — Ambulatory Visit: Payer: No Typology Code available for payment source | Admitting: Internal Medicine

## 2022-03-17 DIAGNOSIS — J309 Allergic rhinitis, unspecified: Secondary | ICD-10-CM

## 2022-08-04 ENCOUNTER — Telehealth: Payer: Self-pay

## 2022-08-04 NOTE — Telephone Encounter (Signed)
Pt has moved to [place]

## 2022-08-04 NOTE — Telephone Encounter (Signed)
PTS INSURANCE  does not cover dulera but will cover wixela, advair generic, breo or advair hfa please advise to change in therapy

## 2022-08-04 NOTE — Telephone Encounter (Signed)
Tried to call pt but voicemail box is full 

## 2022-08-04 NOTE — Telephone Encounter (Signed)
Well I guess that explains why she didn't follow up. She needs to find a provider in her area. Thank you

## 2022-08-04 NOTE — Telephone Encounter (Signed)
Can you please have her make an appointment for OV? She is 5 months overdue. According to the last note she needs to be on triple therapy like trelegy or Breztri. Thank you

## 2022-08-04 NOTE — Telephone Encounter (Signed)
Patient called and said that she has moved back home to [place]
